# Patient Record
Sex: Male | Born: 1990 | Race: White | Hispanic: No | Marital: Single
Health system: Midwestern US, Community
[De-identification: ages and names within clinical notes are randomized; demographics above are authoritative.]

## PROBLEM LIST (undated history)

## (undated) DIAGNOSIS — K219 Gastro-esophageal reflux disease without esophagitis: Secondary | ICD-10-CM

## (undated) DIAGNOSIS — G40909 Epilepsy, unspecified, not intractable, without status epilepticus: Secondary | ICD-10-CM

---

## 2008-02-04 ENCOUNTER — Emergency Department: Payer: Self-pay | Admitting: Emergency Medicine

## 2010-07-31 ENCOUNTER — Emergency Department: Payer: Self-pay | Admitting: Emergency Medicine

## 2010-08-24 ENCOUNTER — Emergency Department: Payer: Self-pay | Admitting: Emergency Medicine

## 2010-11-25 ENCOUNTER — Emergency Department: Payer: Self-pay | Admitting: Emergency Medicine

## 2011-09-22 ENCOUNTER — Emergency Department (HOSPITAL_COMMUNITY)
Admission: EM | Admit: 2011-09-22 | Discharge: 2011-09-22 | Disposition: A | Discharge status: Home / Self Care | Payer: Self-pay | Attending: Emergency Medicine | Admitting: Emergency Medicine

## 2011-09-22 ENCOUNTER — Emergency Department (HOSPITAL_COMMUNITY): Payer: Self-pay

## 2011-09-22 DIAGNOSIS — S62309A Unspecified fracture of unspecified metacarpal bone, initial encounter for closed fracture: Secondary | ICD-10-CM | POA: Insufficient documentation

## 2011-09-22 DIAGNOSIS — S62308A Unspecified fracture of other metacarpal bone, initial encounter for closed fracture: Secondary | ICD-10-CM

## 2011-09-22 DIAGNOSIS — S62339A Displaced fracture of neck of unspecified metacarpal bone, initial encounter for closed fracture: Secondary | ICD-10-CM

## 2011-09-22 DIAGNOSIS — M7989 Other specified soft tissue disorders: Secondary | ICD-10-CM | POA: Insufficient documentation

## 2011-09-22 MED ORDER — OXYCODONE-ACETAMINOPHEN 5-325 MG PO TABS
2.0000 | ORAL_TABLET | Freq: Once | ORAL | Status: AC
  Administered 2011-09-22: 2 via ORAL
  Filled 2011-09-22: qty 2

## 2011-09-22 MED ORDER — IBUPROFEN 200 MG PO TABS
600.0000 mg | ORAL_TABLET | Freq: Once | ORAL | Status: AC
  Administered 2011-09-22: 600 mg via ORAL
  Filled 2011-09-22: qty 3

## 2011-09-22 MED ORDER — DEXTROSE 5 % IV SOLN: 1.0000 g | INTRAVENOUS | Status: DC

## 2011-09-22 MED ORDER — CHLORHEXIDINE GLUCONATE 4 % EX LIQD: 60.0000 mL | Freq: Once | CUTANEOUS | Status: DC

## 2011-09-22 MED ORDER — OXYCODONE-ACETAMINOPHEN 5-325 MG PO TABS: 1.0000 | ORAL_TABLET | Freq: Once | ORAL | Status: DC
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
## 2011-09-22 NOTE — ED Provider Notes (Signed)
Medical screening examination/treatment/procedure(s) were performed by non-physician practitioner and as supervising physician I was immediately available for consultation/collaboration.   Dayton Bailiff, MD 09/22/11 [phone]

## 2011-09-23 ENCOUNTER — Ambulatory Visit (HOSPITAL_COMMUNITY)
Admission: RE | Admit: 2011-09-23 | Discharge: 2011-09-23 | Disposition: A | Discharge status: Home / Self Care | Source: Ambulatory Visit | Attending: Orthopedic Surgery | Admitting: Orthopedic Surgery

## 2011-09-23 ENCOUNTER — Encounter (HOSPITAL_COMMUNITY)
Admission: RE | Disposition: A | Discharge status: Home / Self Care | Payer: Self-pay | Source: Ambulatory Visit | Attending: Orthopedic Surgery

## 2011-09-23 ENCOUNTER — Other Ambulatory Visit: Payer: Self-pay | Admitting: Orthopedic Surgery

## 2011-09-23 ENCOUNTER — Ambulatory Visit (HOSPITAL_COMMUNITY): Admitting: Anesthesiology

## 2011-09-23 ENCOUNTER — Encounter (HOSPITAL_COMMUNITY): Payer: Self-pay | Admitting: Anesthesiology

## 2011-09-23 ENCOUNTER — Encounter (HOSPITAL_COMMUNITY): Payer: Self-pay | Admitting: *Deleted

## 2011-09-23 DIAGNOSIS — Z01812 Encounter for preprocedural laboratory examination: Secondary | ICD-10-CM | POA: Insufficient documentation

## 2011-09-23 DIAGNOSIS — S62309A Unspecified fracture of unspecified metacarpal bone, initial encounter for closed fracture: Secondary | ICD-10-CM

## 2011-09-23 DIAGNOSIS — Y33XXXA Other specified events, undetermined intent, initial encounter: Secondary | ICD-10-CM | POA: Insufficient documentation

## 2011-09-23 DIAGNOSIS — S62329A Displaced fracture of shaft of unspecified metacarpal bone, initial encounter for closed fracture: Secondary | ICD-10-CM | POA: Insufficient documentation

## 2011-09-23 DIAGNOSIS — K219 Gastro-esophageal reflux disease without esophagitis: Secondary | ICD-10-CM | POA: Insufficient documentation

## 2011-09-23 HISTORY — DX: Gastro-esophageal reflux disease without esophagitis: K21.9

## 2011-09-23 HISTORY — PX: ORIF FINGER FRACTURE: SHX2122

## 2011-09-23 LAB — SURGICAL PCR SCREEN
MRSA, PCR: NEGATIVE
Staphylococcus aureus: POSITIVE — AB

## 2011-09-23 LAB — CBC
Hemoglobin: 14.2 g/dL (ref 13.0–17.0)
MCH: 32.6 pg (ref 26.0–34.0)
MCV: 91.3 fL (ref 78.0–100.0)
RBC: 4.36 MIL/uL (ref 4.22–5.81)

## 2011-09-23 SURGERY — OPEN REDUCTION INTERNAL FIXATION (ORIF) METACARPAL (FINGER) FRACTURE
Anesthesia: General | Site: Hand | Laterality: Left

## 2011-09-23 MED ORDER — FENTANYL CITRATE 0.05 MG/ML IJ SOLN
INTRAMUSCULAR | Status: AC
  Filled 2011-09-23: qty 2

## 2011-09-23 MED ORDER — NEOSTIGMINE METHYLSULFATE 1 MG/ML IJ SOLN
INTRAMUSCULAR | Status: DC | PRN
  Administered 2011-09-23: 3.5 mg via INTRAVENOUS

## 2011-09-23 MED ORDER — ONDANSETRON HCL 4 MG/2ML IJ SOLN
INTRAMUSCULAR | Status: DC | PRN
  Administered 2011-09-23: 4 mg via INTRAVENOUS

## 2011-09-23 MED ORDER — BUPIVACAINE HCL (PF) 0.25 % IJ SOLN
INTRAMUSCULAR | Status: DC | PRN
  Administered 2011-09-23: 7 mL

## 2011-09-23 MED ORDER — GLYCOPYRROLATE 0.2 MG/ML IJ SOLN
INTRAMUSCULAR | Status: DC | PRN
  Administered 2011-09-23: .4 mg via INTRAVENOUS

## 2011-09-23 MED ORDER — LACTATED RINGERS IV SOLN
INTRAVENOUS | Status: DC | PRN
  Administered 2011-09-23: 14:00:00 via INTRAVENOUS

## 2011-09-23 MED ORDER — CEFAZOLIN SODIUM 1-5 GM-% IV SOLN
1.0000 g | INTRAVENOUS | Status: AC
  Administered 2011-09-23: 1 g via INTRAVENOUS
  Filled 2011-09-23 (×2): qty 50

## 2011-09-23 MED ORDER — ONDANSETRON HCL 4 MG/2ML IJ SOLN: 4.0000 mg | Freq: Once | INTRAMUSCULAR | Status: DC | PRN

## 2011-09-23 MED ORDER — ROCURONIUM BROMIDE 100 MG/10ML IV SOLN
INTRAVENOUS | Status: DC | PRN
  Administered 2011-09-23: 40 mg via INTRAVENOUS

## 2011-09-23 MED ORDER — HYDROMORPHONE HCL PF 1 MG/ML IJ SOLN: 0.2500 mg | INTRAMUSCULAR | Status: DC | PRN

## 2011-09-23 MED ORDER — BUPIVACAINE-EPINEPHRINE PF 0.5-1:200000 % IJ SOLN
INTRAMUSCULAR | Status: DC | PRN
  Administered 2011-09-23: 30 mL

## 2011-09-23 MED ORDER — OXYCODONE-ACETAMINOPHEN 5-325 MG PO TABS
1.0000 | ORAL_TABLET | Freq: Once | ORAL | Status: AC
  Administered 2011-09-23: 1 via ORAL

## 2011-09-23 MED ORDER — OXYCODONE-ACETAMINOPHEN 5-325 MG PO TABS
ORAL_TABLET | ORAL | Status: AC
  Filled 2011-09-23: qty 1

## 2011-09-23 MED ORDER — SODIUM CHLORIDE 0.9 % IR SOLN
Status: DC | PRN
  Administered 2011-09-23: 1000 mL

## 2011-09-23 MED ORDER — MIDAZOLAM HCL 2 MG/2ML IJ SOLN
1.0000 mg | INTRAMUSCULAR | Status: DC | PRN
  Administered 2011-09-23: 2 mg via INTRAVENOUS

## 2011-09-23 MED ORDER — MIDAZOLAM HCL 2 MG/2ML IJ SOLN
INTRAMUSCULAR | Status: AC
  Filled 2011-09-23: qty 2

## 2011-09-23 MED ORDER — FENTANYL CITRATE 0.05 MG/ML IJ SOLN
50.0000 ug | INTRAMUSCULAR | Status: DC | PRN
  Administered 2011-09-23: 100 ug via INTRAVENOUS

## 2011-09-23 MED ORDER — PROPOFOL 10 MG/ML IV EMUL
INTRAVENOUS | Status: DC | PRN
  Administered 2011-09-23: 150 mg via INTRAVENOUS

## 2011-09-23 MED ORDER — MUPIROCIN 2 % EX OINT
TOPICAL_OINTMENT | CUTANEOUS | Status: AC
  Administered 2011-09-23: 13:00:00
  Filled 2011-09-23: qty 22

## 2011-09-23 MED ORDER — LACTATED RINGERS IV SOLN
INTRAVENOUS | Status: DC
  Administered 2011-09-23: 14:00:00 via INTRAVENOUS

## 2011-09-23 MED ORDER — MEPERIDINE HCL 25 MG/ML IJ SOLN: 6.2500 mg | INTRAMUSCULAR | Status: DC | PRN

## 2011-09-23 MED ORDER — OXYCODONE-ACETAMINOPHEN 5-325 MG PO TABS: 1.0000 | ORAL_TABLET | Freq: Once | ORAL | Status: AC

## 2011-09-23 SURGICAL SUPPLY — 56 items
BANDAGE ELASTIC 3 VELCRO ST LF (GAUZE/BANDAGES/DRESSINGS) IMPLANT
BANDAGE GAUZE ELAST BULKY 4 IN (GAUZE/BANDAGES/DRESSINGS) ×2 IMPLANT
BNDG ADH 5X3 H2O RPLNT NS (GAUZE/BANDAGES/DRESSINGS)
BNDG CMPR 9X4 STRL LF SNTH (GAUZE/BANDAGES/DRESSINGS) ×2
BNDG COHESIVE 1X5 TAN STRL LF (GAUZE/BANDAGES/DRESSINGS) IMPLANT
BNDG COHESIVE 3X5 WHT NS (GAUZE/BANDAGES/DRESSINGS) IMPLANT
BNDG ESMARK 4X9 LF (GAUZE/BANDAGES/DRESSINGS) ×4 IMPLANT
CLOTH BEACON ORANGE TIMEOUT ST (SAFETY) ×2 IMPLANT
CORDS BIPOLAR (ELECTRODE) ×2 IMPLANT
COVER SURGICAL LIGHT HANDLE (MISCELLANEOUS) ×2 IMPLANT
CUFF TOURNIQUET SINGLE 18IN (TOURNIQUET CUFF) ×2 IMPLANT
CUFF TOURNIQUET SINGLE 24IN (TOURNIQUET CUFF) IMPLANT
DRAPE OEC MINIVIEW 54X84 (DRAPES) ×4 IMPLANT
DRAPE SURG 17X23 STRL (DRAPES) ×2 IMPLANT
DURAPREP 26ML APPLICATOR (WOUND CARE) ×2 IMPLANT
GAUZE SPONGE 2X2 8PLY STRL LF (GAUZE/BANDAGES/DRESSINGS) IMPLANT
GAUZE SPONGE 4X4 12PLY STRL LF (GAUZE/BANDAGES/DRESSINGS) ×2 IMPLANT
GAUZE XEROFORM 1X8 LF (GAUZE/BANDAGES/DRESSINGS) ×2 IMPLANT
GLOVE BIO SURGEON STRL SZ8.5 (GLOVE) ×2 IMPLANT
GLOVE BIOGEL PI IND STRL 7.0 (GLOVE) ×2 IMPLANT
GLOVE BIOGEL PI INDICATOR 7.0 (GLOVE) ×2
GLOVE ECLIPSE 7.5 STRL STRAW (GLOVE) ×2 IMPLANT
GOWN SRG XL XLNG 56XLVL 4 (GOWN DISPOSABLE) IMPLANT
GOWN STRL NON-REIN LRG LVL3 (GOWN DISPOSABLE) ×2 IMPLANT
GOWN STRL NON-REIN XL XLG LVL4 (GOWN DISPOSABLE)
GOWN STRL REIN XL XLG (GOWN DISPOSABLE) ×2 IMPLANT
KIT BASIN OR (CUSTOM PROCEDURE TRAY) ×2 IMPLANT
KIT ROOM TURNOVER OR (KITS) ×2 IMPLANT
KWIRE 4.0 X .045IN (WIRE) ×2 IMPLANT
MANIFOLD NEPTUNE II (INSTRUMENTS) ×2 IMPLANT
NAIL FIXATION IM (Nail) ×4 IMPLANT
NEEDLE HYPO 25GX1X1/2 BEV (NEEDLE) IMPLANT
NEEDLE HYPO 25X1 1.5 SAFETY (NEEDLE) ×2 IMPLANT
NS IRRIG 1000ML POUR BTL (IV SOLUTION) ×2 IMPLANT
PACK ORTHO EXTREMITY (CUSTOM PROCEDURE TRAY) ×2 IMPLANT
PAD ARMBOARD 7.5X6 YLW CONV (MISCELLANEOUS) ×2 IMPLANT
PAD CAST 3X4 CTTN HI CHSV (CAST SUPPLIES) IMPLANT
PADDING CAST COTTON 3X4 STRL (CAST SUPPLIES)
PADDING WEBRIL 4 STERILE (GAUZE/BANDAGES/DRESSINGS) ×2 IMPLANT
SPLINT PLASTER X FASTSET 4X15 (CAST SUPPLIES) ×2 IMPLANT
SPONGE GAUZE 2X2 STER 10/PKG (GAUZE/BANDAGES/DRESSINGS)
SPONGE GAUZE 4X4 12PLY (GAUZE/BANDAGES/DRESSINGS) IMPLANT
STRIP CLOSURE SKIN 1/2X4 (GAUZE/BANDAGES/DRESSINGS) IMPLANT
SUCTION FRAZIER TIP 10 FR DISP (SUCTIONS) ×2 IMPLANT
SUT ETHILON 4 0 PS 2 18 (SUTURE) IMPLANT
SUT ETHILON 5 0 PS 2 18 (SUTURE) IMPLANT
SUT PROLENE 3 0 PS 2 (SUTURE) IMPLANT
SUT VIC AB 3-0 FS2 27 (SUTURE) IMPLANT
SUT VIC AB 4-0 P-3 18X BRD (SUTURE) IMPLANT
SUT VIC AB 4-0 P3 18 (SUTURE)
SYR CONTROL 10ML LL (SYRINGE) ×2 IMPLANT
TOWEL OR 17X24 6PK STRL BLUE (TOWEL DISPOSABLE) ×2 IMPLANT
TOWEL OR 17X26 10 PK STRL BLUE (TOWEL DISPOSABLE) ×2 IMPLANT
TUBE CONNECTING 12X1/4 (SUCTIONS) ×2 IMPLANT
UNDERPAD 30X30 INCONTINENT (UNDERPADS AND DIAPERS) ×2 IMPLANT
WATER STERILE IRR 1000ML POUR (IV SOLUTION) ×2 IMPLANT

## 2011-09-23 NOTE — Anesthesia Preprocedure Evaluation (Addendum)
Anesthesia Evaluation  Patient identified by MRN, date of birth, ID band Patient awake    Reviewed: Allergy & Precautions, H&P , Patient's Chart, lab work & pertinent test results  Airway       Dental   Pulmonary          Cardiovascular     Neuro/Psych    GI/Hepatic Medicated and Controlled,  Endo/Other    Renal/GU      Musculoskeletal   Abdominal   Peds  Hematology   Anesthesia Other Findings   Reproductive/Obstetrics                           Anesthesia Physical Anesthesia Plan  ASA: II  Anesthesia Plan: General   Post-op Pain Management:    Induction:   Airway Management Planned:   Additional Equipment:   Intra-op Plan:   Post-operative Plan:   Informed Consent: I have reviewed the patients History and Physical, chart, labs and discussed the procedure including the risks, benefits and alternatives for the proposed anesthesia with the patient or authorized representative who has indicated his/her understanding and acceptance.     Plan Discussed with: CRNA and Surgeon  Anesthesia Plan Comments:         Anesthesia Quick Evaluation

## 2011-09-23 NOTE — Preoperative (Signed)
Beta Blockers   Reason not to administer Beta Blockers:Not Applicable 

## 2011-09-23 NOTE — Op Note (Signed)
NAMEGAVYNN, DUVALL            ACCOUNT NO.:  [id number]  MEDICAL RECORD NO.:  [id number]  LOCATION:  [facility]                         FACILITY:  [facility]  PHYSICIAN:  Artist Pais. Christelle Igoe, M.D.DATE OF BIRTH:  [birth year]  DATE OF PROCEDURE:  09/23/2011 DATE OF DISCHARGE:  09/23/2011                              OPERATIVE REPORT   PREOPERATIVE DIAGNOSIS:  Displaced left small and ring finger metacarpal fractures.  POSTOPERATIVE DIAGNOSIS:  Displaced left small and ring finger metacarpal fractures.  PROCEDURE:  Open reduction internal fixation of above using 1.6-mm IM rods.  SURGEON:  Artist Pais. Mina Marble, MD  ASSISTANT:  None.  ANESTHESIA:  General.  TOURNIQUET TIME:  52 minutes.  COMPLICATIONS:  None.  DRAINS:  None.  DESCRIPTION OF PROCEDURE:  The patient was taken to the operating suite. After the induction of general anesthesia, left upper extremity was prepped and draped in sterile fashion.  An Esmarch was used to exsanguinate the limb.  Tourniquet was then inflated to 250 mmHg.  At this point in time, incision was made over the base of the small finger metacarpal.  Fluoroscopic guidance was used to identify the metaphysis- diaphyseal flare.  A small incision was made.  Extensor tendons were retracted.  The 1.6-mm [employer] IM rod was introduced into the intramedullary canal and driven across the fracture site under fluoroscopic guidance, was cut below the skin, bent to 90 degrees. Incision was then extended transversely through the base of the ring metacarpal.  Cortical window was made using an awl and the rod was passed through the proximal fragment, however, could not be passed into the distal fragment.  Intraoperative fluoroscopy revealed a significantly tight isthmus of the intramedullary canal.  Therefore, the fracture site was opened through an extended incision longitudinally and distally, flap was raised.  The fracture site was opened and a 4.5 K- wire  was used to ream intramedullary canal.  The IM rod was then passed across the fracture site.  The same procedure was performed as far as the rod being bent and cut below the skin.  The wound was irrigated and loosely closed with 4-0 Vicryl Rapide suture. Xeroform, 4x4s, fluffs, and a compressive dressing was applied, as well as a volar splint.  The patient tolerated the procedure well and went to the recovery room in a stable fashion.     Artist Pais Mina Marble, M.D.     MAW/MEDQ  D:  09/23/2011  T:  09/23/2011  Job:  [id number]

## 2011-09-23 NOTE — Discharge Instructions (Signed)
Call office for follow up next week  Do not change dressing  Keep elevated with iceHand Fracture Your caregiver has diagnosed you with a fractured (broken) bone in your hand. If the bones are in good position and the hand is properly immobilized and rested, these injuries will usually heal in 3 to 6 weeks. A cast, splint, or bulky bandage is usually applied to keep the fracture site from moving. Do not remove the splint or cast until your caregiver approves. If the fracture is unstable or the bones are not aligned properly, surgery may be needed. Keep your hand raised (elevated) above the level of your heart as much as possible for the next 2 to 3 days until the swelling and pain are better. Apply ice packs for 15 to 20 minutes every 3 to 4 hours to help control the pain and swelling. See your caregiver or an orthopedic specialist as directed for follow-up care to make sure the fracture is beginning to heal properly. SEEK IMMEDIATE MEDICAL CARE IF:   You notice your fingers are cold, numb, crooked, or the pain of your injury is severe.   You are not improving or seem to be getting worse.   You have questions or concerns.  Document Released: 11/28/2004 Document Revised: 07/03/2011 Document Reviewed: 04/18/2009 [facility] Patient Information 2012 [place], [place].
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
## 2011-09-23 NOTE — Anesthesia Procedure Notes (Signed)
Anesthesia Regional Block:  Supraclavicular block  Pre-Anesthetic Checklist: ,, timeout performed, Correct Patient, Correct Site, Correct Laterality, Correct Procedure, Correct Position, site marked, Risks and benefits discussed,  Surgical consent,  Pre-op evaluation,  At surgeon's request and post-op pain management  Laterality: Left  Prep: chloraprep       Needles:  Injection technique: Single-shot  Needle Type: Echogenic Stimulator Needle     Needle Length: 5cm 5 cm Needle Gauge: 21 G    Additional Needles:  Procedures: ultrasound guided Supraclavicular block  Nerve Stimulator or Paresthesia:  Response: 0.4 mA,   Additional Responses:   Narrative:  Start time: 09/23/2011 1:50 PM End time: 09/23/2011 2:05 PM Injection made incrementally with aspirations every 5 mL.  Performed by: Personally  Anesthesiologist: Arta Bruce MD

## 2011-09-23 NOTE — H&P (Signed)
  See dictated note [phone]

## 2011-09-23 NOTE — Anesthesia Postprocedure Evaluation (Signed)
  Anesthesia Post-op Note  Patient: Jeremiah Alvarez  Procedure(s) Performed:  OPEN REDUCTION INTERNAL FIXATION (ORIF) METACARPAL (FINGER) FRACTURE - orif left ring and small metacarpal fractures  Patient Location: PACU  Anesthesia Type: General  Level of Consciousness: awake, oriented, sedated and patient cooperative  Airway and Oxygen Therapy: Patient Spontanous Breathing and Patient connected to nasal cannula oxygen  Post-op Pain: none  Post-op Assessment: Post-op Vital signs reviewed, Patient's Cardiovascular Status Stable, Respiratory Function Stable, Patent Airway, No signs of Nausea or vomiting and Pain level controlled  Post-op Vital Signs: stable  Complications: No apparent anesthesia complications

## 2011-09-23 NOTE — Brief Op Note (Signed)
09/23/2011  3:33 PM  PATIENT:  Jeremiah Alvarez  20 y.o. male  PRE-OPERATIVE DIAGNOSIS:  left ring finger and small metacarpal fractures  POST-OPERATIVE DIAGNOSIS:  left ring finger and small metacarpal fractures  PROCEDURE:  Procedure(s): OPEN REDUCTION INTERNAL FIXATION (ORIF) METACARPAL (FINGER) FRACTURE  SURGEON:  Surgeon(s): Marlowe Shores, MD  PHYSICIAN ASSISTANT:   ASSISTANTS: none   ANESTHESIA:   general  EBL:  Total I/O In: 700 [I.V.:700] Out: -   BLOOD ADMINISTERED:none  DRAINS: none   LOCAL MEDICATIONS USED:  MARCAINE 10CC  SPECIMEN:  No Specimen  DISPOSITION OF SPECIMEN:  N/A  COUNTS:  YES  TOURNIQUET:   Total Tourniquet Time Documented: Upper Arm (Left) - 60 minutes  DICTATION: .Other Dictation: Dictation Number [id number]  PLAN OF CARE: Discharge to home after PACU  PATIENT DISPOSITION:  PACU - hemodynamically stable.   Delay start of Pharmacological VTE agent (>24hrs) due to surgical blood loss or risk of bleeding:  {YES/NO/NOT APPLICABLE:20182

## 2011-09-23 NOTE — Transfer of Care (Signed)
Immediate Anesthesia Transfer of Care Note  Patient: Jeremiah Alvarez  Procedure(s) Performed:  OPEN REDUCTION INTERNAL FIXATION (ORIF) METACARPAL (FINGER) FRACTURE - orif left ring and small metacarpal fractures  Patient Location: PACU  Anesthesia Type: General  Level of Consciousness: awake, alert  and oriented  Airway & Oxygen Therapy: Patient Spontanous Breathing and Patient connected to nasal cannula oxygen  Post-op Assessment: Report given to PACU RN, Post -op Vital signs reviewed and stable and Patient moving all extremities  Post vital signs: Reviewed and stable  Complications: No apparent anesthesia complications

## 2011-09-24 ENCOUNTER — Encounter (HOSPITAL_COMMUNITY): Payer: Self-pay | Admitting: Orthopedic Surgery

## 2011-11-05 ENCOUNTER — Emergency Department (HOSPITAL_COMMUNITY): Payer: Self-pay

## 2011-11-05 ENCOUNTER — Emergency Department (HOSPITAL_COMMUNITY)
Admission: EM | Admit: 2011-11-05 | Discharge: 2011-11-05 | Disposition: A | Discharge status: Home / Self Care | Payer: Self-pay | Attending: Emergency Medicine | Admitting: Emergency Medicine

## 2011-11-05 ENCOUNTER — Encounter (HOSPITAL_COMMUNITY): Payer: Self-pay | Admitting: Emergency Medicine

## 2011-11-05 DIAGNOSIS — M79609 Pain in unspecified limb: Secondary | ICD-10-CM | POA: Insufficient documentation

## 2011-11-05 DIAGNOSIS — Z9889 Other specified postprocedural states: Secondary | ICD-10-CM | POA: Insufficient documentation

## 2011-11-05 DIAGNOSIS — S6990XA Unspecified injury of unspecified wrist, hand and finger(s), initial encounter: Secondary | ICD-10-CM | POA: Insufficient documentation

## 2011-11-05 DIAGNOSIS — M25549 Pain in joints of unspecified hand: Secondary | ICD-10-CM | POA: Insufficient documentation

## 2011-11-05 DIAGNOSIS — K219 Gastro-esophageal reflux disease without esophagitis: Secondary | ICD-10-CM | POA: Insufficient documentation

## 2011-11-05 DIAGNOSIS — F17200 Nicotine dependence, unspecified, uncomplicated: Secondary | ICD-10-CM | POA: Insufficient documentation

## 2011-11-05 DIAGNOSIS — Z79899 Other long term (current) drug therapy: Secondary | ICD-10-CM | POA: Insufficient documentation

## 2011-11-05 DIAGNOSIS — W1809XA Striking against other object with subsequent fall, initial encounter: Secondary | ICD-10-CM | POA: Insufficient documentation

## 2011-11-05 MED ORDER — OXYCODONE-ACETAMINOPHEN 5-325 MG PO TABS: 1.0000 | ORAL_TABLET | ORAL | Status: AC | PRN

## 2011-11-05 MED ORDER — OXYCODONE-ACETAMINOPHEN 5-325 MG PO TABS
2.0000 | ORAL_TABLET | Freq: Once | ORAL | Status: AC
  Administered 2011-11-05: 2 via ORAL
  Filled 2011-11-05: qty 2

## 2011-11-05 NOTE — ED Notes (Signed)
Per pt: fell and injured left hand last night; hand is swollen with painful movement of 4th and 5th fingers, cap refill normal

## 2011-11-05 NOTE — ED Notes (Signed)
Splint applied by ortho tech

## 2011-11-05 NOTE — ED Provider Notes (Signed)
History     CSN: [id number]  Arrival date & time 11/05/11  1739   First MD Initiated Contact with Patient 11/05/11 1853      Chief Complaint  Patient presents with  . Hand Injury    Left side   HPI Patient presents to the ED with complaint of FOSH injury last night to the left hand. Reports pain over the area he had an ORIF in November. Denies any other problems. Denies any other injuries. Full sensation.   Past Medical History  Diagnosis Date  . GERD (gastroesophageal reflux disease)     Past Surgical History  Procedure Date  . Orif finger fracture 09/23/2011    Procedure: OPEN REDUCTION INTERNAL FIXATION (ORIF) METACARPAL (FINGER) FRACTURE;  Surgeon: Marlowe Shores, MD;  Location: [facility] OR;  Service: Orthopedics;  Laterality: Left;  orif left ring and small metacarpal fractures    History reviewed. No pertinent family history.  History  Substance Use Topics  . Smoking status: Current Everyday Smoker -- 0.5 packs/day for 10 years    Types: Cigarettes  . Smokeless tobacco: Not on file  . Alcohol Use: Yes      Review of Systems  Constitutional: Negative for fever, chills, diaphoresis and appetite change.  HENT: Negative for neck pain.   Eyes: Negative for photophobia and visual disturbance.  Respiratory: Negative for cough, chest tightness and shortness of breath.   Cardiovascular: Negative for chest pain.  Gastrointestinal: Negative for nausea, vomiting and abdominal pain.  Genitourinary: Negative for flank pain.  Musculoskeletal: Negative for back pain, joint swelling and gait problem.  Skin: Negative for rash and wound.  Neurological: Negative for weakness and numbness.  All other systems reviewed and are negative.    Allergies  Review of patient's allergies indicates no known allergies.  Home Medications   Current Outpatient Rx  Name Route Sig Dispense Refill  . ESOMEPRAZOLE MAGNESIUM 20 MG PO CPDR Oral Take 20 mg by mouth daily before breakfast.         BP 115/70  Pulse 88  Temp(Src) 98.4 F (36.9 C) (Oral)  Resp 18  SpO2 96%  Physical Exam  Nursing note and vitals reviewed. Constitutional: He is oriented to person, place, and time. He appears well-developed and well-nourished. No distress.  HENT:  Head: Normocephalic and atraumatic.  Mouth/Throat: Oropharynx is clear and moist.  Eyes: EOM are normal. Pupils are equal, round, and reactive to light.  Neck: Normal range of motion. Neck supple.  Cardiovascular: Normal rate, regular rhythm, normal heart sounds and intact distal pulses.  Exam reveals no gallop and no friction rub.   No murmur heard. Pulmonary/Chest: Effort normal and breath sounds normal. No respiratory distress. He has no wheezes. He exhibits no tenderness.  Abdominal: Soft. Bowel sounds are normal. There is no tenderness. There is no rebound and no guarding.  Musculoskeletal: Normal range of motion. He exhibits tenderness.       Tenderness to palpation to the left fourth/fifth metacarpal. Full sensation. Radial pulse intact. Normal ROM.   Neurological: He is alert and oriented to person, place, and time. He displays normal reflexes. No cranial nerve deficit or sensory deficit. He exhibits normal muscle tone. He displays a negative Romberg sign. Coordination and gait normal. GCS eye subscore is 4. GCS verbal subscore is 5. GCS motor subscore is 6. He displays no Babinski's sign on the right side. He displays no Babinski's sign on the left side.       Normal finger to nose  testing. Normal grip. No pronator drift. No nystagmus on examination.  Skin: Skin is warm and dry. No rash noted. He is not diaphoretic. No erythema. No pallor.  Psychiatric: He has a normal mood and affect. His behavior is normal. Judgment and thought content normal.    ED Course  Procedures (including critical care time)  Patient seen and evaluated.  VSS reviewed. . Nursing notes reviewed. Discussed with attending physician, dr. Patria Mane. Initial  testing ordered. Will monitor the patient closely. They agree with the treatment plan and diagnosis.   Labs Reviewed - No data to display Dg Hand Complete Left  11/05/2011  *RADIOLOGY REPORT*  Clinical Data: Pain.  Surgery of the hand 1 month ago.  The patient fell last night, injuring the hand.  LEFT HAND - COMPLETE 3+ VIEW  Comparison: 09/23/2011  Findings: The patient has undergone pinning of the fourth and fifth metacarpals.  There is callus at both fracture sites.  No evidence for new fracture.  There is persistent lucency at the fracture sites however, raising the question of non healing.  Alignment is normal. There is soft tissue swelling along the ulnar aspect of the fifth metacarpal.  IMPRESSION:  1.  No evidence for new fracture. 2.  Status post ORIF of fourth and fifth metacarpal fractures. Question of non healing at the fracture sites.  Original Report Authenticated By: Patterson Hammersmith, M.D.   No diagnosis found.  7:54 PM spoke to dr. Merlyn Lot who advised placing patient in ulnar gutter splint, and follow up with dr. Mina Marble in the office this week.    MDM  Hand injury, left. No signs of acute fracture. Ulnar gutter splint applied.         Demetrius Charity, PA 11/05/11 2000

## 2011-11-06 NOTE — ED Provider Notes (Signed)
Medical screening examination/treatment/procedure(s) were performed by non-physician practitioner and as supervising physician I was immediately available for consultation/collaboration.   Lyanne Co, MD 11/06/11 [phone]

## 2011-11-09 ENCOUNTER — Encounter (HOSPITAL_COMMUNITY): Payer: Self-pay | Admitting: *Deleted

## 2011-11-09 ENCOUNTER — Emergency Department (HOSPITAL_COMMUNITY)
Admission: EM | Admit: 2011-11-09 | Discharge: 2011-11-09 | Disposition: A | Discharge status: Home / Self Care | Payer: Self-pay | Attending: Emergency Medicine | Admitting: Emergency Medicine

## 2011-11-09 DIAGNOSIS — M79642 Pain in left hand: Secondary | ICD-10-CM

## 2011-11-09 DIAGNOSIS — M7989 Other specified soft tissue disorders: Secondary | ICD-10-CM | POA: Insufficient documentation

## 2011-11-09 DIAGNOSIS — K219 Gastro-esophageal reflux disease without esophagitis: Secondary | ICD-10-CM | POA: Insufficient documentation

## 2011-11-09 DIAGNOSIS — Z79899 Other long term (current) drug therapy: Secondary | ICD-10-CM | POA: Insufficient documentation

## 2011-11-09 DIAGNOSIS — M79609 Pain in unspecified limb: Secondary | ICD-10-CM | POA: Insufficient documentation

## 2011-11-09 DIAGNOSIS — Z9889 Other specified postprocedural states: Secondary | ICD-10-CM | POA: Insufficient documentation

## 2011-11-09 DIAGNOSIS — F17200 Nicotine dependence, unspecified, uncomplicated: Secondary | ICD-10-CM | POA: Insufficient documentation

## 2011-11-09 MED ORDER — HYDROCODONE-ACETAMINOPHEN 5-325 MG PO TABS
1.0000 | ORAL_TABLET | Freq: Once | ORAL | Status: AC
  Administered 2011-11-09: 1 via ORAL
  Filled 2011-11-09: qty 1

## 2011-11-09 MED ORDER — HYDROCODONE-ACETAMINOPHEN 5-325 MG PO TABS: 1.0000 | ORAL_TABLET | Freq: Four times a day (QID) | ORAL | Status: AC | PRN

## 2011-11-09 NOTE — ED Provider Notes (Signed)
History     CSN: [id number]  Arrival date & time 11/09/11  1858   First MD Initiated Contact with Patient 11/09/11 2110      Chief Complaint  Patient presents with  . Hand Pain    s/p hand surgery on 09/23/11    (Consider location/radiation/quality/duration/timing/severity/associated sxs/prior treatment) HPI Comments: Patient presents the chief complaint of left hand pain.  He had a boxer's fracture repair done on 09/23/2011.  On 11/05/2011 the patient fell and presented to the emergency department with pain.  At that time he was re x-rayed with the finding of no new acute fractures however possible nonhealing of his previously repaired fracture.  Patient presents today because the pain has become unbearable and he is unable to followup with his orthopedic until next week.  Patient has no other complaints.  Patient is a 21 y.o. male presenting with hand pain. The history is provided by the patient.  Hand Pain Pertinent negatives include no abdominal pain, chest pain, chills, congestion, fever, headaches, numbness or weakness.    Past Medical History  Diagnosis Date  . GERD (gastroesophageal reflux disease)     Past Surgical History  Procedure Date  . Orif finger fracture 09/23/2011    Procedure: OPEN REDUCTION INTERNAL FIXATION (ORIF) METACARPAL (FINGER) FRACTURE;  Surgeon: Marlowe Shores, MD;  Location: [facility] OR;  Service: Orthopedics;  Laterality: Left;  orif left ring and small metacarpal fractures    History reviewed. No pertinent family history.  History  Substance Use Topics  . Smoking status: Current Everyday Smoker -- 0.5 packs/day for 10 years    Types: Cigarettes  . Smokeless tobacco: Not on file  . Alcohol Use: Yes      Review of Systems  Constitutional: Negative for fever, chills and appetite change.  HENT: Negative for congestion.   Eyes: Negative for visual disturbance.  Respiratory: Negative for shortness of breath.   Cardiovascular: Negative for  chest pain and leg swelling.  Gastrointestinal: Negative for abdominal pain.  Genitourinary: Negative for dysuria, urgency and frequency.  Neurological: Negative for dizziness, syncope, weakness, light-headedness, numbness and headaches.  Psychiatric/Behavioral: Negative for confusion.    Allergies  Review of patient's allergies indicates no known allergies.  Home Medications   Current Outpatient Rx  Name Route Sig Dispense Refill  . ESOMEPRAZOLE MAGNESIUM 40 MG PO CPDR Oral Take 40 mg by mouth daily before breakfast.      . OXYCODONE-ACETAMINOPHEN 5-325 MG PO TABS Oral Take 1 tablet by mouth every 4 (four) hours as needed for pain. May take 2 tablets PO q 6 hours for severe pain - Do not take with Tylenol as this tablet already contains tylenol 15 tablet 0    There were no vitals taken for this visit.  Physical Exam  Nursing note and vitals reviewed. Constitutional: He is oriented to person, place, and time. He appears well-developed and well-nourished. No distress.  HENT:  Head: Normocephalic and atraumatic.  Eyes:       Normal appearance  Neck: Normal range of motion.  Musculoskeletal:       Mild swelling and tenderness of left hand dorsal surface.  Postoperative scar noted.  Patients from surgery palpable.  Full range of motion of wrist intact.  Passive and active range of motion to 90s pain.  Sensation intact.  Neurological: He is alert and oriented to person, place, and time.  Psychiatric: He has a normal mood and affect. His behavior is normal.    ED Course  Procedures (  including critical care time)  Labs Reviewed - No data to display No results found.   No diagnosis found.  No new injury or trauma has occurred since the pack patient's previous x-ray on the first.Patient will be placed in an ulnar gutter splint with recommendation to keep his followup appointment next week with Dr. Mina Marble.  Patient will be discharged with pain management as well and instruction to  elevate and ice the area.    MDM  Post op hand pain         Jaci Carrel, [place] 11/09/11 2155

## 2011-11-09 NOTE — ED Notes (Signed)
Pt had surgery for a severe boxer's fracture that he sustained in November.  Pt had reparative surgery on 09/23/11 for said fracture.  On 11/05/11, pt fell on wet steps and caught himself with the affected hand.  Since that point, the pt feels as if the pin that was placed in his hand has shifted.  Pt presents tonight with severe hand pain on the dorsal aspect of his left side.

## 2011-11-10 NOTE — ED Provider Notes (Signed)
Medical screening examination/treatment/procedure(s) were performed by non-physician practitioner and as supervising physician I was immediately available for consultation/collaboration.  Nicholes Stairs, MD 11/10/11 Moses Manners

## 2012-02-19 ENCOUNTER — Encounter (HOSPITAL_BASED_OUTPATIENT_CLINIC_OR_DEPARTMENT_OTHER): Payer: Self-pay | Admitting: *Deleted

## 2012-02-19 ENCOUNTER — Other Ambulatory Visit: Payer: Self-pay | Admitting: Orthopedic Surgery

## 2012-02-21 ENCOUNTER — Encounter (HOSPITAL_BASED_OUTPATIENT_CLINIC_OR_DEPARTMENT_OTHER): Payer: Self-pay | Admitting: Anesthesiology

## 2012-02-21 ENCOUNTER — Ambulatory Visit (HOSPITAL_BASED_OUTPATIENT_CLINIC_OR_DEPARTMENT_OTHER): Payer: Self-pay | Admitting: Anesthesiology

## 2012-02-21 ENCOUNTER — Encounter (HOSPITAL_BASED_OUTPATIENT_CLINIC_OR_DEPARTMENT_OTHER)
Admission: RE | Disposition: A | Discharge status: Home / Self Care | Payer: Self-pay | Source: Ambulatory Visit | Attending: Orthopedic Surgery

## 2012-02-21 ENCOUNTER — Ambulatory Visit (HOSPITAL_BASED_OUTPATIENT_CLINIC_OR_DEPARTMENT_OTHER)
Admission: RE | Admit: 2012-02-21 | Discharge: 2012-02-21 | Disposition: A | Discharge status: Home / Self Care | Payer: Self-pay | Source: Ambulatory Visit | Attending: Orthopedic Surgery | Admitting: Orthopedic Surgery

## 2012-02-21 ENCOUNTER — Encounter (HOSPITAL_BASED_OUTPATIENT_CLINIC_OR_DEPARTMENT_OTHER): Payer: Self-pay | Admitting: *Deleted

## 2012-02-21 DIAGNOSIS — X58XXXS Exposure to other specified factors, sequela: Secondary | ICD-10-CM | POA: Insufficient documentation

## 2012-02-21 DIAGNOSIS — S42309S Unspecified fracture of shaft of humerus, unspecified arm, sequela: Secondary | ICD-10-CM | POA: Insufficient documentation

## 2012-02-21 DIAGNOSIS — Z472 Encounter for removal of internal fixation device: Secondary | ICD-10-CM | POA: Insufficient documentation

## 2012-02-21 DIAGNOSIS — S62329A Displaced fracture of shaft of unspecified metacarpal bone, initial encounter for closed fracture: Secondary | ICD-10-CM

## 2012-02-21 HISTORY — PX: HARDWARE REMOVAL: SHX979

## 2012-02-21 SURGERY — REMOVAL, HARDWARE
Anesthesia: General | Site: Hand | Laterality: Left | Wound class: Clean

## 2012-02-21 MED ORDER — OXYCODONE-ACETAMINOPHEN 5-325 MG PO TABS: 1.0000 | ORAL_TABLET | ORAL | Status: AC | PRN

## 2012-02-21 MED ORDER — CHLORHEXIDINE GLUCONATE 4 % EX LIQD: 60.0000 mL | Freq: Once | CUTANEOUS | Status: DC

## 2012-02-21 MED ORDER — METOCLOPRAMIDE HCL 5 MG/ML IJ SOLN: 10.0000 mg | Freq: Once | INTRAMUSCULAR | Status: DC | PRN

## 2012-02-21 MED ORDER — BUPIVACAINE HCL (PF) 0.25 % IJ SOLN
INTRAMUSCULAR | Status: DC | PRN
  Administered 2012-02-21: 5 mL

## 2012-02-21 MED ORDER — CEFAZOLIN SODIUM 1-5 GM-% IV SOLN
1.0000 g | INTRAVENOUS | Status: AC
  Administered 2012-02-21: 1 g via INTRAVENOUS

## 2012-02-21 MED ORDER — FENTANYL CITRATE 0.05 MG/ML IJ SOLN
INTRAMUSCULAR | Status: DC | PRN
  Administered 2012-02-21: 100 ug via INTRAVENOUS

## 2012-02-21 MED ORDER — PROPOFOL 10 MG/ML IV EMUL
INTRAVENOUS | Status: DC | PRN
  Administered 2012-02-21: 250 mg via INTRAVENOUS

## 2012-02-21 MED ORDER — DEXAMETHASONE SODIUM PHOSPHATE 10 MG/ML IJ SOLN
INTRAMUSCULAR | Status: DC | PRN
  Administered 2012-02-21: 10 mg via INTRAVENOUS

## 2012-02-21 MED ORDER — LACTATED RINGERS IV SOLN
INTRAVENOUS | Status: DC
  Administered 2012-02-21 (×2): via INTRAVENOUS

## 2012-02-21 MED ORDER — LIDOCAINE HCL (CARDIAC) 20 MG/ML IV SOLN
INTRAVENOUS | Status: DC | PRN
  Administered 2012-02-21: 100 mg via INTRAVENOUS

## 2012-02-21 MED ORDER — DROPERIDOL 2.5 MG/ML IJ SOLN
INTRAMUSCULAR | Status: DC | PRN
  Administered 2012-02-21: 0.625 mg via INTRAVENOUS

## 2012-02-21 MED ORDER — MIDAZOLAM HCL 5 MG/5ML IJ SOLN
INTRAMUSCULAR | Status: DC | PRN
  Administered 2012-02-21: 2 mg via INTRAVENOUS

## 2012-02-21 MED ORDER — ONDANSETRON HCL 4 MG/2ML IJ SOLN
INTRAMUSCULAR | Status: DC | PRN
  Administered 2012-02-21: 4 mg via INTRAVENOUS

## 2012-02-21 MED ORDER — OXYCODONE-ACETAMINOPHEN 5-325 MG PO TABS
1.0000 | ORAL_TABLET | Freq: Once | ORAL | Status: AC | PRN
  Administered 2012-02-21: 1 via ORAL

## 2012-02-21 MED ORDER — FENTANYL CITRATE 0.05 MG/ML IJ SOLN
25.0000 ug | INTRAMUSCULAR | Status: DC | PRN
  Administered 2012-02-21: 25 ug via INTRAVENOUS

## 2012-02-21 SURGICAL SUPPLY — 48 items
APL SKNCLS STERI-STRIP NONHPOA (GAUZE/BANDAGES/DRESSINGS)
BANDAGE ELASTIC 3 VELCRO ST LF (GAUZE/BANDAGES/DRESSINGS) ×2 IMPLANT
BANDAGE ELASTIC 4 VELCRO ST LF (GAUZE/BANDAGES/DRESSINGS) IMPLANT
BANDAGE GAUZE ELAST BULKY 4 IN (GAUZE/BANDAGES/DRESSINGS) ×2 IMPLANT
BENZOIN TINCTURE PRP APPL 2/3 (GAUZE/BANDAGES/DRESSINGS) IMPLANT
BLADE MINI RND TIP GREEN BEAV (BLADE) IMPLANT
BLADE SURG 15 STRL LF DISP TIS (BLADE) ×1 IMPLANT
BLADE SURG 15 STRL SS (BLADE) ×2
BNDG CMPR 9X4 STRL LF SNTH (GAUZE/BANDAGES/DRESSINGS) ×1
BNDG ESMARK 4X9 LF (GAUZE/BANDAGES/DRESSINGS) ×2 IMPLANT
CLOTH BEACON ORANGE TIMEOUT ST (SAFETY) ×2 IMPLANT
CORDS BIPOLAR (ELECTRODE) ×2 IMPLANT
COVER TABLE BACK 60X90 (DRAPES) ×2 IMPLANT
CUFF TOURNIQUET SINGLE 18IN (TOURNIQUET CUFF) ×2 IMPLANT
DECANTER SPIKE VIAL GLASS SM (MISCELLANEOUS) IMPLANT
DRAPE EXTREMITY T 121X128X90 (DRAPE) ×2 IMPLANT
DRAPE OEC MINIVIEW 54X84 (DRAPES) IMPLANT
DRAPE SURG 17X23 STRL (DRAPES) ×2 IMPLANT
DURAPREP 26ML APPLICATOR (WOUND CARE) ×2 IMPLANT
GAUZE SPONGE 4X4 16PLY XRAY LF (GAUZE/BANDAGES/DRESSINGS) ×2 IMPLANT
GAUZE XEROFORM 1X8 LF (GAUZE/BANDAGES/DRESSINGS) ×4 IMPLANT
GLOVE BIO SURGEON STRL SZ8.5 (GLOVE) ×2 IMPLANT
GLOVE BIOGEL M STRL SZ7.5 (GLOVE) ×2 IMPLANT
GOWN PREVENTION PLUS XLARGE (GOWN DISPOSABLE) IMPLANT
GOWN PREVENTION PLUS XXLARGE (GOWN DISPOSABLE) ×4 IMPLANT
NEEDLE HYPO 25X1 1.5 SAFETY (NEEDLE) ×2 IMPLANT
PACK BASIN DAY SURGERY FS (CUSTOM PROCEDURE TRAY) ×2 IMPLANT
PAD CAST 4YDX4 CTTN HI CHSV (CAST SUPPLIES) ×1 IMPLANT
PADDING CAST ABS 4INX4YD NS (CAST SUPPLIES) ×1
PADDING CAST ABS COTTON 4X4 ST (CAST SUPPLIES) ×1 IMPLANT
PADDING CAST COTTON 4X4 STRL (CAST SUPPLIES) ×2
SHEET MEDIUM DRAPE 40X70 STRL (DRAPES) ×2 IMPLANT
SPONGE GAUZE 4X4 12PLY (GAUZE/BANDAGES/DRESSINGS) ×2 IMPLANT
STOCKINETTE 4X48 STRL (DRAPES) ×2 IMPLANT
STRIP CLOSURE SKIN 1/2X4 (GAUZE/BANDAGES/DRESSINGS) IMPLANT
SUT ETHILON 3 0 PS 1 (SUTURE) IMPLANT
SUT ETHILON 5 0 PS 2 18 (SUTURE) IMPLANT
SUT MON AB 4-0 PC3 18 (SUTURE) IMPLANT
SUT PROLENE 3 0 PS 2 (SUTURE) IMPLANT
SUT VIC AB 0 CT3 27 (SUTURE) IMPLANT
SUT VIC AB 3-0 PS1 18 (SUTURE)
SUT VIC AB 3-0 PS1 18XBRD (SUTURE) IMPLANT
SUT VICRYL 4-0 PS2 18IN ABS (SUTURE) IMPLANT
SUT VICRYL RAPIDE 4/0 PS 2 (SUTURE) ×2 IMPLANT
SYR BULB 3OZ (MISCELLANEOUS) ×2 IMPLANT
SYRINGE 10CC LL (SYRINGE) ×2 IMPLANT
UNDERPAD 30X30 INCONTINENT (UNDERPADS AND DIAPERS) ×2 IMPLANT
WATER STERILE IRR 1000ML POUR (IV SOLUTION) ×2 IMPLANT

## 2012-02-21 NOTE — Transfer of Care (Signed)
Immediate Anesthesia Transfer of Care Note  Patient: Jeremiah Alvarez  Procedure(s) Performed: Procedure(s) (LRB): HARDWARE REMOVAL (Left)  Patient Location: PACU  Anesthesia Type: General  Level of Consciousness: sedated  Airway & Oxygen Therapy: Patient Spontanous Breathing and Patient connected to face mask oxygen  Post-op Assessment: Report given to PACU RN and Post -op Vital signs reviewed and stable  Post vital signs: Reviewed and stable  Complications: No apparent anesthesia complications

## 2012-02-21 NOTE — Op Note (Signed)
See dictated note [phone]

## 2012-02-21 NOTE — H&P (Signed)
Jeremiah Alvarez is an 21 y.o. male.   Chief Complaint: right hand pain and swelling HPI: 21 y/o male s/p orif right 4,5 metacarpal fracrures now with hardware pain  History reviewed. No pertinent past medical history.  Past Surgical History  Procedure Date  . Orif finger fracture 09/23/2011    Procedure: OPEN REDUCTION INTERNAL FIXATION (ORIF) METACARPAL (FINGER) FRACTURE;  Surgeon: Marlowe Shores, MD;  Location: [facility];  Service: Orthopedics;  Laterality: Left;  orif left ring and small metacarpal fractures    History reviewed. No pertinent family history. Social History:  reports that he has been smoking Cigarettes.  He has a 5 pack-year smoking history. He has never used smokeless tobacco. He reports that he drinks alcohol. He reports that he does not use illicit drugs.  Allergies: No Known Allergies  Medications Prior to Admission  Medication Dose Route Frequency Provider Last Rate Last Dose  . ceFAZolin (ANCEF) IVPB 1 g/50 mL premix  1 g Intravenous 60 min Pre-Op Marlowe Shores, MD      . chlorhexidine (HIBICLENS) 4 % liquid 4 application  60 mL Topical Once Marlowe Shores, MD      . lactated ringers infusion   Intravenous Continuous Zenon Mayo, MD 10 mL/hr at 02/21/12 1318     No current outpatient prescriptions on file as of 02/21/2012.    No results found for this or any previous visit (from the past 48 hour(s)). No results found.  Review of Systems  All other systems reviewed and are negative.    Blood pressure 103/65, temperature 98.7 F (37.1 C), temperature source Oral, resp. rate 20, height 5\' 7"  (1.702 m), weight 65.772 kg (145 lb), SpO2 98.00%. Physical Exam  Constitutional: He is oriented to person, place, and time. He appears well-developed and well-nourished.  HENT:  Head: Normocephalic and atraumatic.  Cardiovascular: Normal rate.   Respiratory: Effort normal.  Musculoskeletal:       Right hand: He exhibits tenderness and swelling.    Neurological: He is alert and oriented to person, place, and time.  Skin: Skin is warm.  Psychiatric: He has a normal mood and affect. His speech is normal and behavior is normal. Thought content normal.     Assessment/Plan As above  Plan hardware removal  Zephyr Sausedo A 02/21/2012, 2:17 PM

## 2012-02-21 NOTE — Anesthesia Postprocedure Evaluation (Signed)
Anesthesia Post Note  Patient: Jeremiah Alvarez  Procedure(s) Performed: Procedure(s) (LRB): HARDWARE REMOVAL (Left)  Anesthesia type: General  Patient location: PACU  Post pain: Pain level controlled  Post assessment: Patient's Cardiovascular Status Stable  Last Vitals:  Filed Vitals:   02/21/12 1617  BP: 84/44  Pulse: 63  Temp:   Resp: 17    Post vital signs: Reviewed and stable  Level of consciousness: alert  Complications: No apparent anesthesia complications

## 2012-02-21 NOTE — Anesthesia Preprocedure Evaluation (Signed)
Anesthesia Evaluation  Patient identified by MRN, date of birth, ID band Patient awake    Reviewed: Allergy & Precautions, H&P , NPO status , Patient's Chart, lab work & pertinent test results, reviewed documented beta blocker date and time   Airway Mallampati: II TM Distance: >3 FB Neck ROM: full    Dental   Pulmonary neg pulmonary ROS,          Cardiovascular negative cardio ROS      Neuro/Psych negative neurological ROS  negative psych ROS   GI/Hepatic negative GI ROS, Neg liver ROS,   Endo/Other  negative endocrine ROS  Renal/GU negative Renal ROS  negative genitourinary   Musculoskeletal   Abdominal   Peds  Hematology negative hematology ROS (+)   Anesthesia Other Findings See surgeon's H&P   Reproductive/Obstetrics negative OB ROS                           Anesthesia Physical Anesthesia Plan  ASA: I  Anesthesia Plan: General   Post-op Pain Management:    Induction: Intravenous  Airway Management Planned: LMA  Additional Equipment:   Intra-op Plan:   Post-operative Plan: Extubation in OR  Informed Consent: I have reviewed the patients History and Physical, chart, labs and discussed the procedure including the risks, benefits and alternatives for the proposed anesthesia with the patient or authorized representative who has indicated his/her understanding and acceptance.     Plan Discussed with: CRNA and Surgeon  Anesthesia Plan Comments:         Anesthesia Quick Evaluation  

## 2012-02-21 NOTE — Anesthesia Procedure Notes (Signed)
Procedure Name: LMA Insertion Date/Time: 02/21/2012 3:08 PM Performed by: Burna Cash Pre-anesthesia Checklist: Patient identified, Emergency Drugs available, Suction available and Patient being monitored Patient Re-evaluated:Patient Re-evaluated prior to inductionOxygen Delivery Method: Circle System Utilized Preoxygenation: Pre-oxygenation with 100% oxygen Intubation Type: IV induction Ventilation: Mask ventilation without difficulty LMA: LMA inserted LMA Size: 5.0 Number of attempts: 1 Airway Equipment and Method: bite block Placement Confirmation: positive ETCO2 Tube secured with: Tape Dental Injury: Teeth and Oropharynx as per pre-operative assessment

## 2012-02-21 NOTE — Discharge Instructions (Signed)

## 2012-02-21 NOTE — Brief Op Note (Signed)
02/21/2012  3:36 PM  PATIENT:  Jeremiah Alvarez  21 y.o. male  PRE-OPERATIVE DIAGNOSIS:  left hand painful hardware  POST-OPERATIVE DIAGNOSIS:  Left Hand Painful Hardware  PROCEDURE:  Procedure(s) (LRB): HARDWARE REMOVAL (Left)  SURGEON:  Surgeon(s) and Role:    * Marlowe Shores, MD - Primary  PHYSICIAN ASSISTANT:   ASSISTANTS: none   ANESTHESIA:   general  EBL:  Total I/O In: 1000 [I.V.:1000] Out: -   BLOOD ADMINISTERED:none  DRAINS: none   LOCAL MEDICATIONS USED:  MARCAINE   5cc  SPECIMEN:  No Specimen  DISPOSITION OF SPECIMEN:  N/A  COUNTS:  YES  TOURNIQUET:   Total Tourniquet Time Documented: area (laterality) - 11 minutes  DICTATION: .Other Dictation: Dictation Number [id number]  PLAN OF CARE: Discharge to home after PACU  PATIENT DISPOSITION:  PACU - hemodynamically stable.   Delay start of Pharmacological VTE agent (>24hrs) due to surgical blood loss or risk of bleeding: not applicable

## 2012-02-22 NOTE — Op Note (Signed)
NAMESHEFFIELD, HAWKER            ACCOUNT NO.:  [id number]  MEDICAL RECORD NO.:  [id number]  LOCATION:  WTR6                         FACILITY:  [facility]  PHYSICIAN:  Artist Pais. Linetta Regner, M.D.DATE OF BIRTH:  Jun 27, 1991  DATE OF PROCEDURE:  02/21/2012 DATE OF DISCHARGE:  02/21/2012                              OPERATIVE REPORT   PREOPERATIVE DIAGNOSIS:  Retained painful hardware, right hand.  POSTOPERATIVE DIAGNOSIS:  Retained painful hardware, right hand.  PROCEDURE:  Total removal of extensor tenolysis, right hand.  SURGEON:  Artist Pais. Mina Marble, MD  ASSISTANT:  None.  ANESTHESIA:  General.  TOURNIQUET TIME:  11 minute  COMPLICATIONS:  No complication.  DRAINS:  No drains.  DESCRIPTION OF PROCEDURE:  The patient was taken to the operating suite. After induction of general anesthesia, right upper extremity prepped and draped in sterile fashion.  Esmarch was used to exsanguinate the limb. Tourniquet was inflated to 250 mmHg.  At this point in time, an L-shaped incision was made to the dorsal aspect of the right hand along the ulnar border using his previous incision.  Skin was incised.  The proximal based flap was elevated.  Dissection was carried down the base of the ring and small metacarpals.  EDC and EDQ tendons were lysed all adhesions.  There retracted the IM rod was removed without difficulty. There was a small bursa around the each rod and this was carefully resected.  The wound was thoroughly irrigated, loosely closed with 4-0 Vicryl Rapide suture.  Xeroform, 4x4s, fluffs, and compressive hand dressing was applied.  The patient tolerated the procedure well and went to recovery room in stable.     Artist Pais Mina Marble, M.D.     MAW/MEDQ  D:  02/21/2012  T:  02/22/2012  Job:  [id number]

## 2012-02-24 ENCOUNTER — Encounter (HOSPITAL_BASED_OUTPATIENT_CLINIC_OR_DEPARTMENT_OTHER): Payer: Self-pay | Admitting: Orthopedic Surgery

## 2012-02-24 LAB — POCT HEMOGLOBIN-HEMACUE: Hemoglobin: 15.3 g/dL (ref 13.0–17.0)

## 2012-11-12 ENCOUNTER — Encounter (HOSPITAL_COMMUNITY): Payer: Self-pay | Admitting: Emergency Medicine

## 2012-11-12 ENCOUNTER — Emergency Department (HOSPITAL_COMMUNITY): Payer: No Typology Code available for payment source

## 2012-11-12 ENCOUNTER — Emergency Department (HOSPITAL_COMMUNITY)
Admission: EM | Admit: 2012-11-12 | Discharge: 2012-11-12 | Disposition: A | Discharge status: Home / Self Care | Payer: No Typology Code available for payment source | Attending: Emergency Medicine | Admitting: Emergency Medicine

## 2012-11-12 DIAGNOSIS — M542 Cervicalgia: Secondary | ICD-10-CM | POA: Insufficient documentation

## 2012-11-12 DIAGNOSIS — R079 Chest pain, unspecified: Secondary | ICD-10-CM | POA: Insufficient documentation

## 2012-11-12 DIAGNOSIS — IMO0002 Reserved for concepts with insufficient information to code with codable children: Secondary | ICD-10-CM | POA: Insufficient documentation

## 2012-11-12 DIAGNOSIS — F17200 Nicotine dependence, unspecified, uncomplicated: Secondary | ICD-10-CM | POA: Insufficient documentation

## 2012-11-12 DIAGNOSIS — Y92410 Unspecified street and highway as the place of occurrence of the external cause: Secondary | ICD-10-CM | POA: Insufficient documentation

## 2012-11-12 DIAGNOSIS — R040 Epistaxis: Secondary | ICD-10-CM | POA: Insufficient documentation

## 2012-11-12 DIAGNOSIS — Y9389 Activity, other specified: Secondary | ICD-10-CM | POA: Insufficient documentation

## 2012-11-12 DIAGNOSIS — S0990XA Unspecified injury of head, initial encounter: Secondary | ICD-10-CM | POA: Insufficient documentation

## 2012-11-12 DIAGNOSIS — S022XXA Fracture of nasal bones, initial encounter for closed fracture: Secondary | ICD-10-CM | POA: Insufficient documentation

## 2012-11-12 MED ORDER — OXYCODONE-ACETAMINOPHEN 5-325 MG PO TABS: 1.0000 | ORAL_TABLET | Freq: Once | ORAL | Status: DC

## 2012-11-12 MED ORDER — OXYCODONE-ACETAMINOPHEN 5-325 MG PO TABS
1.0000 | ORAL_TABLET | Freq: Once | ORAL | Status: AC
  Administered 2012-11-12: 1 via ORAL
  Filled 2012-11-12: qty 1
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
## 2012-11-12 NOTE — ED Notes (Signed)
Pt back from radiology 
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
## 2012-11-13 NOTE — ED Provider Notes (Signed)
Medical screening examination/treatment/procedure(s) were performed by non-physician practitioner and as supervising physician I was immediately available for consultation/collaboration.  Tobin Chad, MD 11/13/12 [phone]

## 2013-07-06 ENCOUNTER — Emergency Department (HOSPITAL_COMMUNITY): Payer: Self-pay

## 2013-07-06 ENCOUNTER — Emergency Department (HOSPITAL_COMMUNITY)
Admission: EM | Admit: 2013-07-06 | Discharge: 2013-07-06 | Disposition: A | Discharge status: Home / Self Care | Payer: Self-pay | Attending: Emergency Medicine | Admitting: Emergency Medicine

## 2013-07-06 ENCOUNTER — Encounter (HOSPITAL_COMMUNITY): Payer: Self-pay | Admitting: *Deleted

## 2013-07-06 DIAGNOSIS — Y9389 Activity, other specified: Secondary | ICD-10-CM | POA: Insufficient documentation

## 2013-07-06 DIAGNOSIS — S6991XA Unspecified injury of right wrist, hand and finger(s), initial encounter: Secondary | ICD-10-CM

## 2013-07-06 DIAGNOSIS — S6990XA Unspecified injury of unspecified wrist, hand and finger(s), initial encounter: Secondary | ICD-10-CM | POA: Insufficient documentation

## 2013-07-06 DIAGNOSIS — F17200 Nicotine dependence, unspecified, uncomplicated: Secondary | ICD-10-CM | POA: Insufficient documentation

## 2013-07-06 DIAGNOSIS — X58XXXA Exposure to other specified factors, initial encounter: Secondary | ICD-10-CM | POA: Insufficient documentation

## 2013-07-06 DIAGNOSIS — Y929 Unspecified place or not applicable: Secondary | ICD-10-CM | POA: Insufficient documentation

## 2013-07-06 DIAGNOSIS — Z9889 Other specified postprocedural states: Secondary | ICD-10-CM | POA: Insufficient documentation

## 2013-07-06 MED ORDER — NAPROXEN 500 MG PO TABS: 500.0000 mg | ORAL_TABLET | Freq: Two times a day (BID) | ORAL | Status: DC

## 2013-07-06 NOTE — ED Notes (Signed)
Patient transported to X-ray 

## 2013-07-06 NOTE — ED Provider Notes (Signed)
Medical screening examination/treatment/procedure(s) were performed by non-physician practitioner and as supervising physician I was immediately available for consultation/collaboration.   Gilda Crease, MD 07/06/13 [phone]

## 2013-07-06 NOTE — ED Provider Notes (Signed)
CSN: [id number]     Arrival date & time 07/06/13  4401 History   First MD Initiated Contact with Patient 07/06/13 [phone]     Chief Complaint  Patient presents with  . Hand Injury   (Consider location/radiation/quality/duration/timing/severity/associated sxs/prior Treatment) HPI Comments: Patient is a 22 year old male who presents with right hand pain since last night. The pain started suddenly when he was trying to break up a dog fight. The pain is throbbing and severe without radiation. Patient reports associated swelling and bruising. Movement and palpation makes the pain worse. Nothing makes the pain better. Patient did not try anything for pain. Patient denies any other injuries.    History reviewed. No pertinent past medical history. Past Surgical History  Procedure Laterality Date  . Orif finger fracture  09/23/2011    Procedure: OPEN REDUCTION INTERNAL FIXATION (ORIF) METACARPAL (FINGER) FRACTURE;  Surgeon: Marlowe Shores, MD;  Location: [facility];  Service: Orthopedics;  Laterality: Left;  orif left ring and small metacarpal fractures  . Hardware removal  02/21/2012    Procedure: HARDWARE REMOVAL;  Surgeon: Marlowe Shores, MD;  Location: [facility];  Service: Orthopedics;  Laterality: Left;   No family history on file. History  Substance Use Topics  . Smoking status: Current Every Day Smoker -- 0.50 packs/day for 10 years    Types: Cigarettes  . Smokeless tobacco: Never Used  . Alcohol Use: Yes     Comment: OCCAIS    Review of Systems  Musculoskeletal: Positive for joint swelling and arthralgias.  All other systems reviewed and are negative.    Allergies  Review of patient's allergies indicates no known allergies.  Home Medications   Current Outpatient Rx  Name  Route  Sig  Dispense  Refill  . Esomeprazole Magnesium (NEXIUM 24HR PO)   Oral   Take 1 tablet by mouth daily as needed (heartburn).          BP 103/67  Pulse 68  Temp(Src) 97 F (36.1  C) (Oral)  Resp 20  Ht 5\' 7"  (1.702 m)  Wt 140 lb (63.504 kg)  BMI 21.92 kg/m2  SpO2 98% Physical Exam  Nursing note and vitals reviewed. Constitutional: He is oriented to person, place, and time. He appears well-developed and well-nourished. No distress.  HENT:  Head: Normocephalic and atraumatic.  Eyes: Conjunctivae are normal.  Neck: Normal range of motion.  Cardiovascular: Normal rate and regular rhythm.  Exam reveals no gallop and no friction rub.   No murmur heard. Sufficient capillary refill of distal phalanges.   Pulmonary/Chest: Effort normal and breath sounds normal. He has no wheezes. He has no rales. He exhibits no tenderness.  Musculoskeletal: Normal range of motion.  Tenderness to palpation of 3rd metacarpal bone of right hand with associated edema. No obvious deformity. ROM intact of right hand.   Neurological: He is alert and oriented to person, place, and time. Coordination normal.  Sensation of upper extremity intact and equal bilaterally. Speech is goal-oriented. Moves limbs without ataxia.   Skin: Skin is warm and dry.  Psychiatric: He has a normal mood and affect. His behavior is normal.    ED Course  Procedures (including critical care time) Labs Review Labs Reviewed - No data to display Imaging Review Dg Hand Complete Right  07/06/2013   *RADIOLOGY REPORT*  Clinical Data: Right hand injury, pain.  RIGHT HAND - COMPLETE 3+ VIEW  Comparison: None.  Findings: Imaged bones, joints and soft tissues appear normal.  IMPRESSION: Negative exam.   Original Report Authenticated By: Holley Dexter, M.D.    MDM   1. Hand injury, right, initial encounter     8:47 AM Right hand xray pending. Vitals stable and patient afebrile.   9:31 AM Xray unremarkable for acute changes. No neurovascular compromise. No further evaluation needed at this time. Patient instructed to ice and elevate injury.    Emilia Beck, [place] 07/06/13 [phone]

## 2013-07-06 NOTE — ED Notes (Signed)
C/o right hand pain after punching something last night

## 2013-10-07 IMAGING — CR DG HAND COMPLETE 3+V*L*
3 series · 3 of 3 positions shown · non-contrast
Comparison: None.

CLINICAL DATA: Left hand trauma, pain and swelling

LEFT HAND - COMPLETE 3+ VIEW

[x hand pa left]
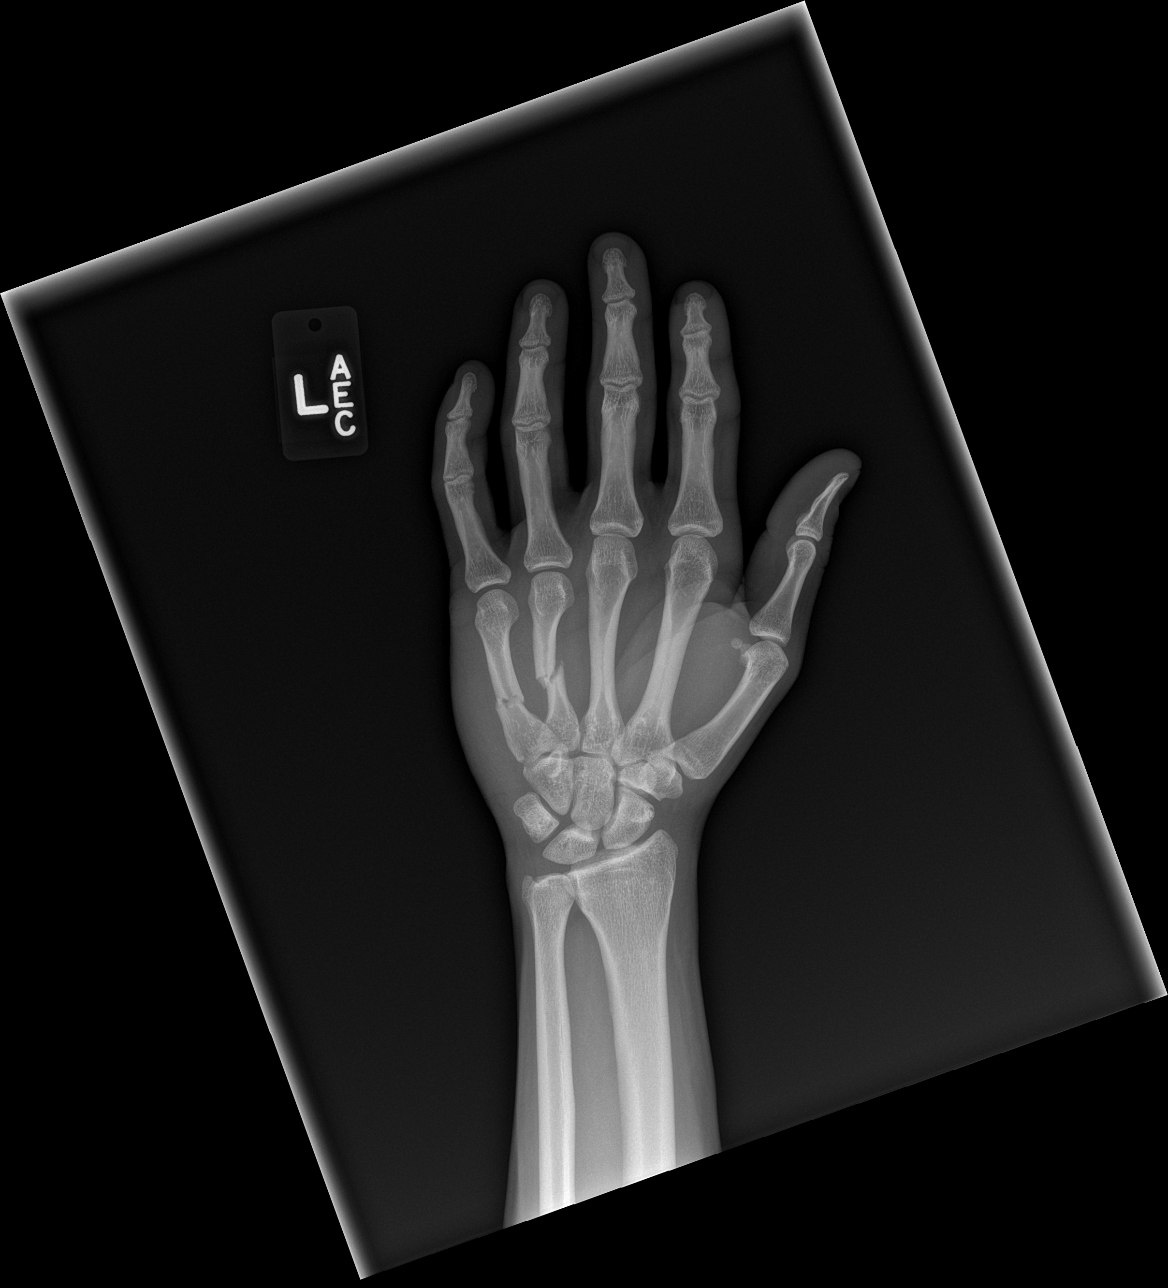

[x hand obl left]
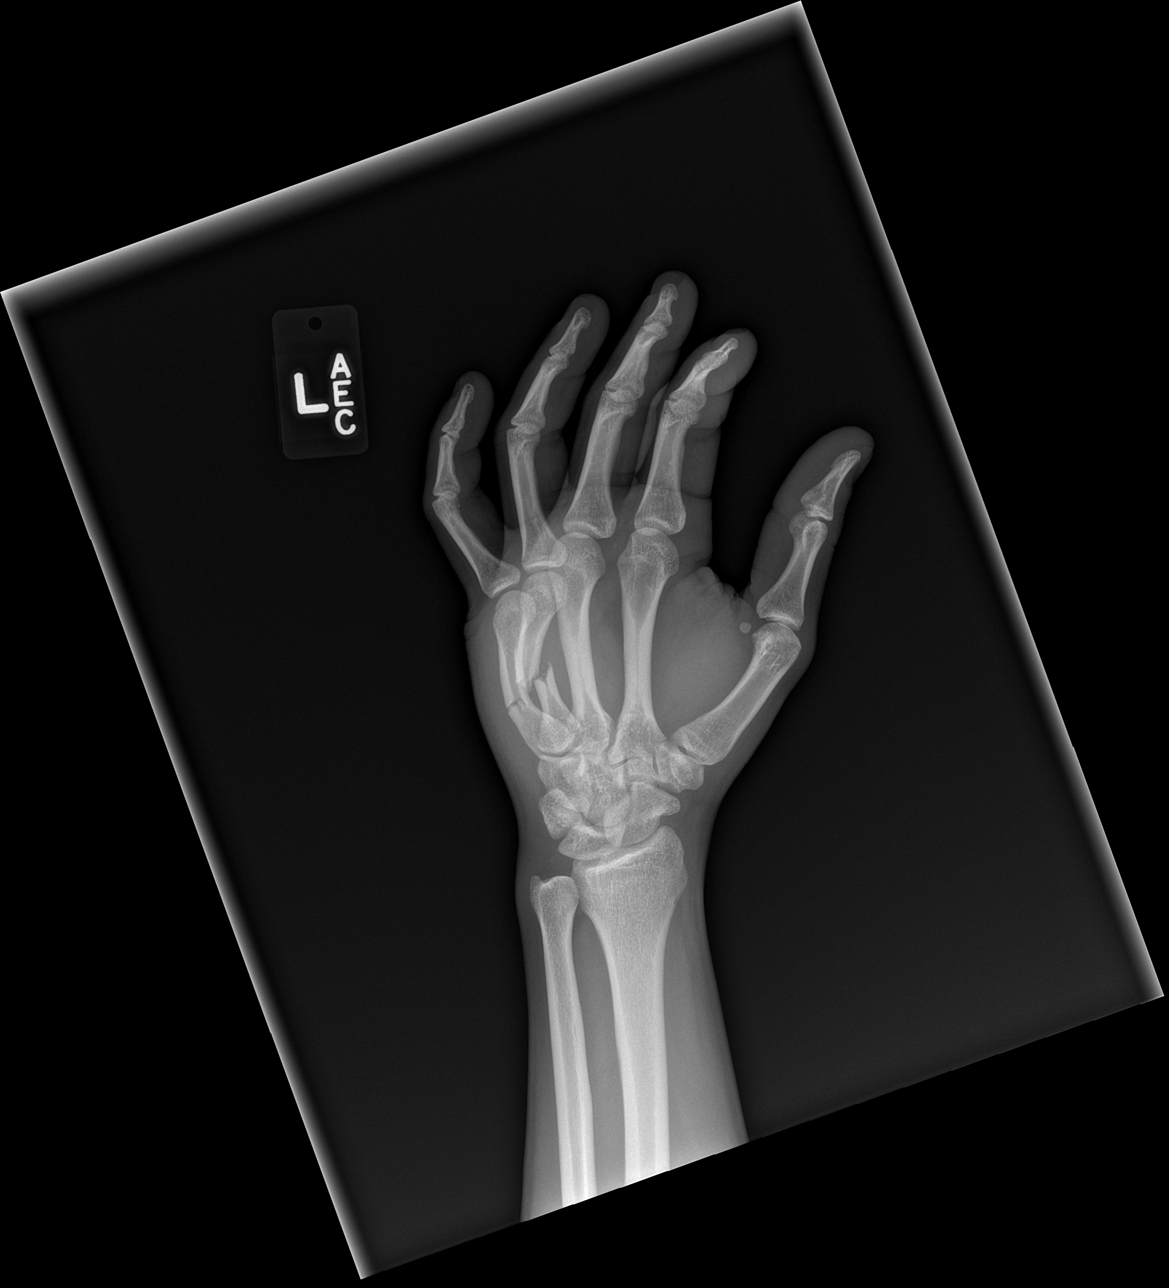

[x hand lat left]
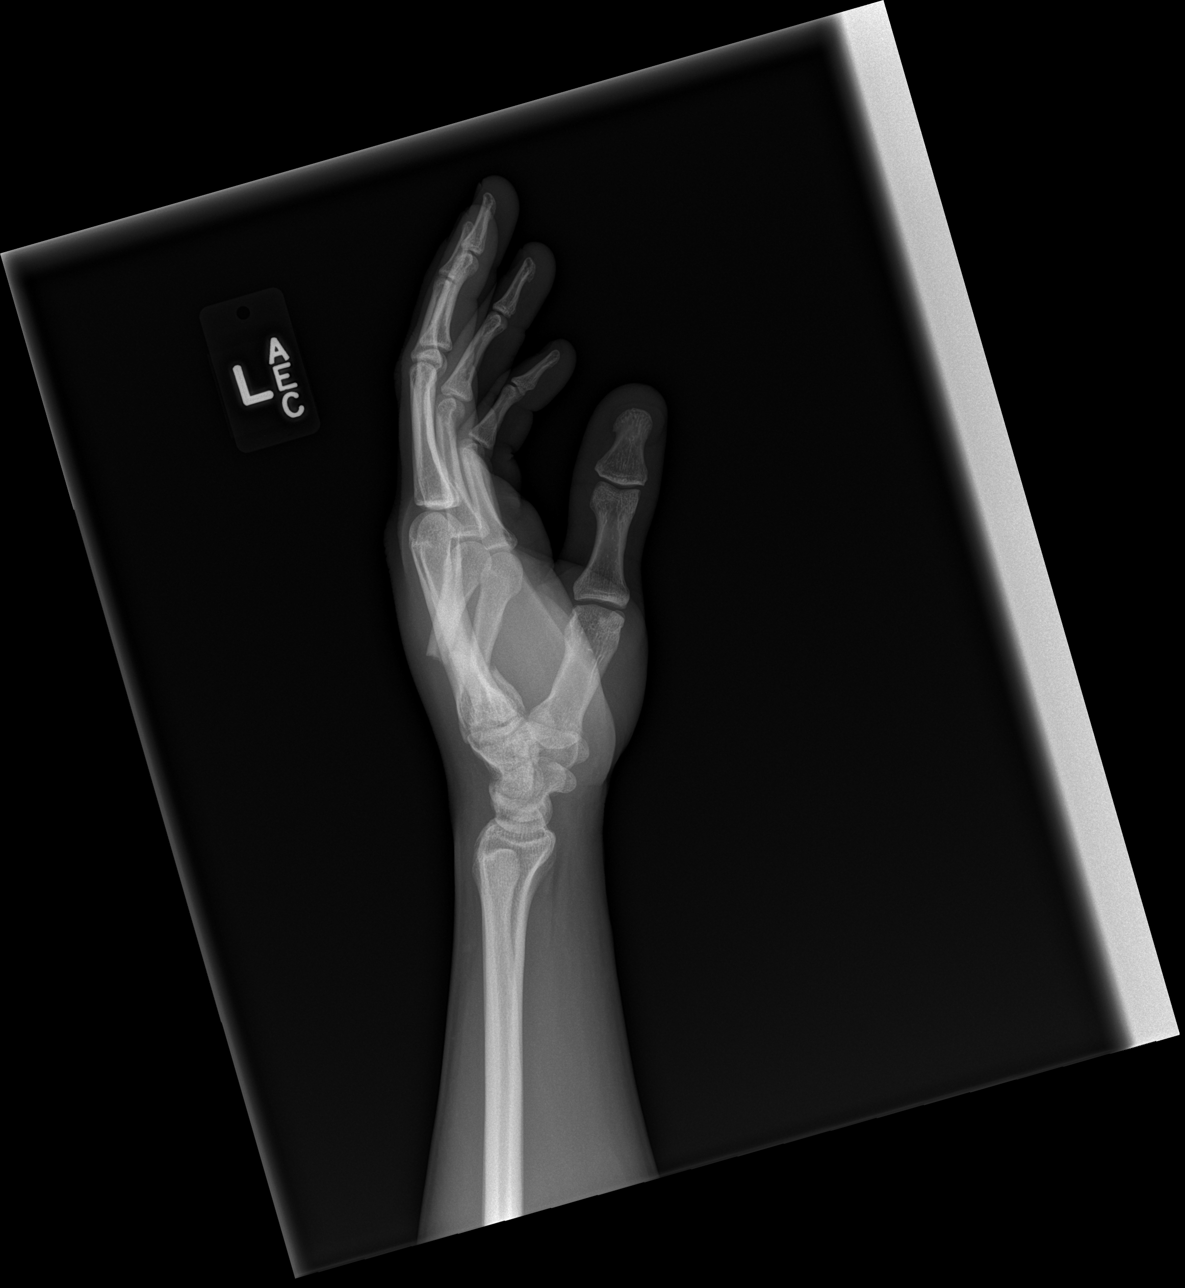

[3 of 3 positions shown; findings below may reference images not displayed]

FINDINGS: There are transverse fractures through the mid shaft left
fourth and fifth metacarpals, with apex dorsal angulation of the
fracture fragments.  Overlying soft tissue swelling is noted.
There is mild comminution.  No radiopaque foreign body.
IMPRESSION: Mid shaft left fourth and fifth metacarpal fractures.

## 2014-03-20 ENCOUNTER — Emergency Department (HOSPITAL_COMMUNITY)
Admission: EM | Admit: 2014-03-20 | Discharge: 2014-03-20 | Disposition: A | Discharge status: Home / Self Care | Payer: Self-pay | Attending: Emergency Medicine | Admitting: Emergency Medicine

## 2014-03-20 ENCOUNTER — Emergency Department (HOSPITAL_COMMUNITY): Payer: Self-pay

## 2014-03-20 ENCOUNTER — Encounter (HOSPITAL_COMMUNITY): Payer: Self-pay | Admitting: Emergency Medicine

## 2014-03-20 DIAGNOSIS — N503 Cyst of epididymis: Secondary | ICD-10-CM

## 2014-03-20 DIAGNOSIS — Z79899 Other long term (current) drug therapy: Secondary | ICD-10-CM | POA: Insufficient documentation

## 2014-03-20 DIAGNOSIS — R634 Abnormal weight loss: Secondary | ICD-10-CM | POA: Insufficient documentation

## 2014-03-20 DIAGNOSIS — F17200 Nicotine dependence, unspecified, uncomplicated: Secondary | ICD-10-CM | POA: Insufficient documentation

## 2014-03-20 DIAGNOSIS — Z791 Long term (current) use of non-steroidal anti-inflammatories (NSAID): Secondary | ICD-10-CM | POA: Insufficient documentation

## 2014-03-20 DIAGNOSIS — N508 Other specified disorders of male genital organs: Secondary | ICD-10-CM | POA: Insufficient documentation

## 2014-03-20 LAB — PROTIME-INR
INR: 0.93 (ref 0.00–1.49)
PROTHROMBIN TIME: 12.3 s (ref 11.6–15.2)

## 2014-03-20 LAB — URINALYSIS, ROUTINE W REFLEX MICROSCOPIC
BILIRUBIN URINE: NEGATIVE
GLUCOSE, UA: NEGATIVE mg/dL
HGB URINE DIPSTICK: NEGATIVE
KETONES UR: NEGATIVE mg/dL
Leukocytes, UA: NEGATIVE
Nitrite: NEGATIVE
PH: 5.5 (ref 5.0–8.0)
PROTEIN: NEGATIVE mg/dL
SPECIFIC GRAVITY, URINE: 1.026 (ref 1.005–1.030)
UROBILINOGEN UA: 1 mg/dL (ref 0.0–1.0)

## 2014-03-20 LAB — CBC WITH DIFFERENTIAL/PLATELET
BASOS ABS: 0 10*3/uL (ref 0.0–0.1)
Basophils Relative: 0 % (ref 0–1)
EOS ABS: 0.2 10*3/uL (ref 0.0–0.7)
EOS PCT: 2 % (ref 0–5)
HEMATOCRIT: 40.2 % (ref 39.0–52.0)
Hemoglobin: 14.6 g/dL (ref 13.0–17.0)
LYMPHS PCT: 40 % (ref 12–46)
Lymphs Abs: 3.1 10*3/uL (ref 0.7–4.0)
MCH: 32.1 pg (ref 26.0–34.0)
MCHC: 36.3 g/dL — AB (ref 30.0–36.0)
MCV: 88.4 fL (ref 78.0–100.0)
MONO ABS: 0.5 10*3/uL (ref 0.1–1.0)
Monocytes Relative: 7 % (ref 3–12)
Neutro Abs: 3.9 10*3/uL (ref 1.7–7.7)
Neutrophils Relative %: 51 % (ref 43–77)
PLATELETS: 225 10*3/uL (ref 150–400)
RBC: 4.55 MIL/uL (ref 4.22–5.81)
RDW: 12.7 % (ref 11.5–15.5)
WBC: 7.7 10*3/uL (ref 4.0–10.5)

## 2014-03-20 LAB — BASIC METABOLIC PANEL
BUN: 13 mg/dL (ref 6–23)
CALCIUM: 9.2 mg/dL (ref 8.4–10.5)
CO2: 24 meq/L (ref 19–32)
CREATININE: 0.86 mg/dL (ref 0.50–1.35)
Chloride: 105 mEq/L (ref 96–112)
GFR calc Af Amer: >90 mL/min (ref >90)
Glucose, Bld: 80 mg/dL (ref 70–99)
Potassium: 4.1 mEq/L (ref 3.7–5.3)
Sodium: 142 mEq/L (ref 137–147)

## 2014-03-20 LAB — APTT: APTT: 33 s (ref 24–37)

## 2014-03-20 MED ORDER — HYDROCODONE-ACETAMINOPHEN 5-325 MG PO TABS: ORAL_TABLET | ORAL | Status: DC

## 2014-03-20 MED ORDER — IBUPROFEN 800 MG PO TABS
800.0000 mg | ORAL_TABLET | Freq: Once | ORAL | Status: AC
  Administered 2014-03-20: 800 mg via ORAL
  Filled 2014-03-20: qty 1

## 2014-03-20 MED ORDER — HYDROCODONE-ACETAMINOPHEN 5-325 MG PO TABS
2.0000 | ORAL_TABLET | Freq: Once | ORAL | Status: AC
  Administered 2014-03-20: 2 via ORAL
  Filled 2014-03-20: qty 2

## 2014-03-20 NOTE — Discharge Instructions (Signed)
For pain control please take ibuprofen (also known as Motrin or Advil) 800mg  (this is normally 4 over the counter pills) 3 times a day  for 5 days. Take with food to minimize stomach irritation.  Take vicodin for breakthrough pain, do not drink alcohol, drive, care for children or do other critical tasks while taking vicodin.   Do not hesitate to return to the Emergency Department for any new, worsening or concerning symptoms.   If you do not have a primary care doctor you can establish one at the   [facility]: [address] [place] [id number] [phone]  After you establish care. Let them know you were seen in the emergency room. They must obtain records for further management.   An epididymal cyst is a smooth, fluid-filled lump found on the testicles. Although some men may worry that they are a sign of cancer or another serious problem, epididymal cysts are in fact benign and thus not a health risk.  The cysts form when fluid builds up in a tube behind the testicles called the epididymis, which stores and transports sperm. It is possible to have more than one cyst at a time and they can appear on both testicles. Up to a third of men are affected by this condition and the cysts can develop at any time in life, although it is most likely to occur in those who are middle-aged.  Epididymal cysts can be very small, but some can grow to the same size as a testicle or even as large as a grapefruit! There are normally no symptoms unless a cyst becomes infected, in which case it may drain fluid and become painful or swollen. Larger epididymal cysts can cause some discomfort, which is usually the reason why some men opt to have them removed. This procedure involves a simple operation to cut the cyst out of the epididymis. However, if a cyst is not infected and does not cause any pain or discomfort, it is not necessary to treat it.  Although epididymal cysts are benign, it is  still important to get any new or unusual lumps and bumps in your genital area checked out by a GP.

## 2014-03-20 NOTE — ED Notes (Addendum)
Pt c/o "knots" to testicle area x 6 months. Pt has not been seen by a Dr for this complaint. Pt reports bright blood and semen discharge. Pt C/o low back pain also. Pt reports weight loss and unable to gain weight. Pt loss 10 lbs in 6 months.

## 2014-03-20 NOTE — ED Notes (Signed)
Pt states he is been having testicle pain since last November, he never when to see the doctor, but now the pain is increasing and is going all the way to his back pt refers is "like stabbing pain 9/10 all around his testicles and on his lower back, pt refers is having burning sensation and some blood on his urine, he is having some breakout of sweat and chills frequently, he feels that he is having some tender nodules around his testicles. He states he is having frequent dark loose stool for a month now.

## 2014-03-20 NOTE — ED Provider Notes (Signed)
CSN: [id number]     Arrival date & time 03/20/14  1116 History   First MD Initiated Contact with Patient 03/20/14 1126     Chief Complaint  Patient presents with  . Testicle Pain  . Back Pain     (Consider location/radiation/quality/duration/timing/severity/associated sxs/prior Treatment) HPI  Velva HarmanChristian Groseclose is a 23 y.o. male complaining of painful swelling to bilateral proximal testicles going on for 6 weeks. Patient also reports hematuria for a month and blood in semen starting last week. Denies dysuria, thick urethral discharge. Patient has unexplained 10 pound weight loss loss in the last month when patient has actually been trying to gain weight. He endorses subjective fever and chills, easy bruising and bleeding. Patient states he works as a [employer] and when his hands get cut they tend to take a long time to stop bleeding. Patient is uninsured, [place]'t [place] the [place] since [place] from [place] recently. Patient denies headache, dizziness, chest pain, shortness of breath, abd pain, nausea vomiting.  History reviewed. No pertinent past medical history. Past Surgical History  Procedure Laterality Date  . Orif finger fracture  09/23/2011    Procedure: OPEN REDUCTION INTERNAL FIXATION (ORIF) METACARPAL (FINGER) FRACTURE;  Surgeon: Marlowe ShoresMatthew A Weingold, MD;  Location: [facility];  Service: Orthopedics;  Laterality: Left;  orif left ring and small metacarpal fractures  . Hardware removal  02/21/2012    Procedure: HARDWARE REMOVAL;  Surgeon: Marlowe ShoresMatthew A Weingold, MD;  Location: [facility];  Service: Orthopedics;  Laterality: Left;   History reviewed. No pertinent family history. History  Substance Use Topics  . Smoking status: Current Every Day Smoker -- 0.50 packs/day for 10 years    Types: Cigarettes  . Smokeless tobacco: Current User  . Alcohol Use: Yes     Comment: OCCAIS    Review of Systems  10 systems reviewed and found to be negative, except as  noted in the HPI.  Allergies  Review of patient's allergies indicates no known allergies.  Home Medications   Prior to Admission medications   Medication Sig Start Date End Date Taking? Authorizing Provider  Esomeprazole Magnesium (NEXIUM 24HR PO) Take 1 tablet by mouth daily as needed (heartburn).    Historical Provider, MD  naproxen (NAPROSYN) 500 MG tablet Take 1 tablet (500 mg total) by mouth 2 (two) times daily with a meal. 07/06/13   Kaitlyn Szekalski, PA-C   BP 113/73  Pulse 61  Temp(Src) 97.8 F (36.6 C) (Oral)  Resp 19  Ht 5\' 7"  (1.702 m)  Wt 146 lb (66.225 kg)  BMI 22.86 kg/m2  SpO2 97% Physical Exam  Nursing note and vitals reviewed. Constitutional: He is oriented to person, place, and time. He appears well-developed and well-nourished. No distress.  HENT:  Head: Normocephalic.  Mouth/Throat: Oropharynx is clear and moist.  Eyes: Conjunctivae and EOM are normal. Pupils are equal, round, and reactive to light.  Neck: Normal range of motion.  Cardiovascular: Normal rate, regular rhythm and intact distal pulses.   Pulmonary/Chest: Effort normal and breath sounds normal. No stridor. No respiratory distress. He has no wheezes. He has no rales. He exhibits no tenderness.  Abdominal: Soft. Bowel sounds are normal. He exhibits no distension and no mass. There is no tenderness. There is no rebound and no guarding.  Genitourinary:  Exam chaperoned by nurse: No testicular swelling, there is mild enlargement of the area where the venous plexus is proximal to the scrotum. He is tender to palpation. There is  no urethral discharge no rash.  Musculoskeletal: Normal range of motion.  Neurological: He is alert and oriented to person, place, and time.  Psychiatric: He has a normal mood and affect.    ED Course  Procedures (including critical care time) Labs Review Labs Reviewed  CBC WITH DIFFERENTIAL - Abnormal; Notable for the following:    MCHC 36.3 (*)    All other components  within normal limits  GC/CHLAMYDIA PROBE AMP  URINALYSIS, ROUTINE W REFLEX MICROSCOPIC  BASIC METABOLIC PANEL  PROTIME-INR  APTT    Imaging Review [place] Scrotum  03/20/2014   CLINICAL DATA:  Right testicular pain for 6 months  EXAM: SCROTAL ULTRASOUND  DOPPLER ULTRASOUND OF THE TESTICLES  TECHNIQUE: Complete ultrasound examination of the testicles, epididymis, and other scrotal structures was performed. Color and spectral Doppler ultrasound were also utilized to evaluate blood flow to the testicles.  COMPARISON:  None.  FINDINGS: Right testicle  Measurements: 4.1 x 2.7 x 1.8 cm. No mass or microlithiasis visualized.  Left testicle  Measurements: 4.3 x 3.0 x 2.0 cm. No mass or microlithiasis visualized.  Right epididymis: Simple appearing cyst measures 1.1 x 0.8 x 0.8 cm. This corresponds to the area of focal pain/mass as indicated by the patient.  Left epididymis:  Simple appearing cyst measures 0.6 x 0.5 x 0.5 cm.  Hydrocele:  Small bilateral hydroceles are noted.  Varicocele:  Small left varicocele.  Pulsed Doppler interrogation of both testes demonstrates low resistance arterial and venous waveforms bilaterally.  IMPRESSION: Normal exam. No intratesticular mass or evidence for testicular torsion.  Bilateral simple appearing epididymal cysts incidentally noted, of which the one on the right corresponds to the area of focal pain and palpable mass as indicated by the patient.   Electronically Signed   By: Christiana PellantGretchen  Green M.D.   On: 03/20/2014 13:08   [place] Art/ven Flow Abd Pelv Doppler  03/20/2014   CLINICAL DATA:  Right testicular pain for 6 months  EXAM: SCROTAL ULTRASOUND  DOPPLER ULTRASOUND OF THE TESTICLES  TECHNIQUE: Complete ultrasound examination of the testicles, epididymis, and other scrotal structures was performed. Color and spectral Doppler ultrasound were also utilized to evaluate blood flow to the testicles.  COMPARISON:  None.  FINDINGS: Right testicle  Measurements: 4.1 x 2.7 x 1.8 cm. No  mass or microlithiasis visualized.  Left testicle  Measurements: 4.3 x 3.0 x 2.0 cm. No mass or microlithiasis visualized.  Right epididymis: Simple appearing cyst measures 1.1 x 0.8 x 0.8 cm. This corresponds to the area of focal pain/mass as indicated by the patient.  Left epididymis:  Simple appearing cyst measures 0.6 x 0.5 x 0.5 cm.  Hydrocele:  Small bilateral hydroceles are noted.  Varicocele:  Small left varicocele.  Pulsed Doppler interrogation of both testes demonstrates low resistance arterial and venous waveforms bilaterally.  IMPRESSION: Normal exam. No intratesticular mass or evidence for testicular torsion.  Bilateral simple appearing epididymal cysts incidentally noted, of which the one on the right corresponds to the area of focal pain and palpable mass as indicated by the patient.   Electronically Signed   By: Christiana PellantGretchen  Green M.D.   On: 03/20/2014 13:08     EKG Interpretation None      MDM   Final diagnoses:  Epididymal cyst  Unexplained weight loss   Filed Vitals:   03/20/14 1200 03/20/14 1415 03/20/14 1430 03/20/14 1445  BP: 102/61 79/37 97/54  113/73  Pulse: 60 54 58 61  Temp:      TempSrc:  Resp:      Height:      Weight:      SpO2: 97% 100% 99% 97%    Medications  HYDROcodone-acetaminophen (NORCO/VICODIN) 5-325 MG per tablet 2 tablet (2 tablets Oral Given 03/20/14 1401)  ibuprofen (ADVIL,MOTRIN) tablet 800 mg (800 mg Oral Given 03/20/14 1509)    Montgomery Favor is a 23 y.o. male presenting with intermittent testicular pain for 6 weeks, unexplained weight  loss, subjective fever or chills and using bleeding. Patient has not had outpatient care. Ultrasound shows no acute abnormality but notes several epididymal cyst. Bloodwork and UA reassuring. GC chlamydia pending. Recommend urology and primary care followup.  Evaluation does not show pathology that would require ongoing emergent intervention or inpatient treatment. Pt is hemodynamically stable and mentating  appropriately. Discussed findings and plan with patient/guardian, who agrees with care plan. All questions answered. Return precautions discussed and outpatient follow up given.   New Prescriptions   HYDROCODONE-ACETAMINOPHEN (NORCO/VICODIN) 5-325 MG PER TABLET    Take 1-2 tablets by mouth every 6 hours as needed for pain.    Note: Portions of this report may have been transcribed using voice recognition software. Every effort was made to ensure accuracy; however, inadvertent computerized transcription errors may be present     Wynetta Emery, PA-C 03/20/14 1523

## 2014-03-20 NOTE — ED Notes (Signed)
Patient still at US. Family given drink.

## 2014-03-21 LAB — GC/CHLAMYDIA PROBE AMP
CT Probe RNA: NEGATIVE
GC Probe RNA: NEGATIVE

## 2014-03-22 NOTE — ED Provider Notes (Signed)
Medical screening examination/treatment/procedure(s) were performed by non-physician practitioner and as supervising physician I was immediately available for consultation/collaboration.   EKG Interpretation None       Shelda JakesScott W. Kathalene Sporer, MD 03/22/14 [phone]

## 2014-12-25 ENCOUNTER — Emergency Department: Payer: Self-pay | Admitting: Family Medicine

## 2015-02-10 ENCOUNTER — Emergency Department
Admit: 2015-02-10 | Disposition: A | Discharge status: Home / Self Care | Payer: Self-pay | Admitting: Emergency Medicine

## 2015-04-12 ENCOUNTER — Emergency Department
Admission: EM | Admit: 2015-04-12 | Discharge: 2015-04-12 | Disposition: A | Discharge status: Home / Self Care | Payer: Self-pay | Attending: Emergency Medicine | Admitting: Emergency Medicine

## 2015-04-12 ENCOUNTER — Encounter: Payer: Self-pay | Admitting: Emergency Medicine

## 2015-04-12 DIAGNOSIS — H6121 Impacted cerumen, right ear: Secondary | ICD-10-CM | POA: Insufficient documentation

## 2015-04-12 DIAGNOSIS — S41112A Laceration without foreign body of left upper arm, initial encounter: Secondary | ICD-10-CM

## 2015-04-12 DIAGNOSIS — S51812A Laceration without foreign body of left forearm, initial encounter: Secondary | ICD-10-CM | POA: Insufficient documentation

## 2015-04-12 DIAGNOSIS — Y9389 Activity, other specified: Secondary | ICD-10-CM | POA: Insufficient documentation

## 2015-04-12 DIAGNOSIS — Z720 Tobacco use: Secondary | ICD-10-CM | POA: Insufficient documentation

## 2015-04-12 DIAGNOSIS — Y288XXA Contact with other sharp object, undetermined intent, initial encounter: Secondary | ICD-10-CM | POA: Insufficient documentation

## 2015-04-12 DIAGNOSIS — Y9289 Other specified places as the place of occurrence of the external cause: Secondary | ICD-10-CM | POA: Insufficient documentation

## 2015-04-12 DIAGNOSIS — Y990 Civilian activity done for income or pay: Secondary | ICD-10-CM | POA: Insufficient documentation

## 2015-04-12 MED ORDER — LIDOCAINE-EPINEPHRINE (PF) 1 %-1:200000 IJ SOLN
30.0000 mL | Freq: Once | INTRAMUSCULAR | Status: AC
  Administered 2015-04-12: 30 mL via INTRADERMAL

## 2015-04-12 MED ORDER — NEOMYCIN-POLYMYXIN-HC 3.5-10000-1 OT SOLN: 3.0000 [drp] | Freq: Three times a day (TID) | OTIC | Status: AC

## 2015-04-12 MED ORDER — CARBAMIDE PEROXIDE 6.5 % OT SOLN
OTIC | Status: AC
  Administered 2015-04-12: 5 [drp] via OTIC
  Filled 2015-04-12: qty 15

## 2015-04-12 MED ORDER — CARBAMIDE PEROXIDE 6.5 % OT SOLN
5.0000 [drp] | Freq: Once | OTIC | Status: AC
Start: 2015-04-12 — End: 2015-04-12
  Administered 2015-04-12: 5 [drp] via OTIC

## 2015-04-12 MED ORDER — CARBAMIDE PEROXIDE 6.5 % OT SOLN
OTIC | Status: AC
  Filled 2015-04-12: qty 15

## 2015-04-12 MED ORDER — CARBAMIDE PEROXIDE 6.5 % OT SOLN: 5.0000 [drp] | Freq: Once | OTIC | Status: DC

## 2015-04-12 MED ORDER — LIDOCAINE-EPINEPHRINE (PF) 1 %-1:200000 IJ SOLN
INTRAMUSCULAR | Status: AC
  Administered 2015-04-12: 30 mL via INTRADERMAL
  Filled 2015-04-12: qty 30

## 2015-04-12 MED ORDER — LIDOCAINE-EPINEPHRINE 1 %-1:100000 IJ SOLN
10.0000 mL | Freq: Once | INTRAMUSCULAR | Status: DC
  Filled 2015-04-12: qty 10
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
## 2015-04-12 NOTE — ED Notes (Signed)
NAD noted at time of D/C. Pt denies questions or concerns. Pt ambulatory to the lobby at this time.  

## 2015-04-12 NOTE — Discharge Instructions (Signed)
Cerumen Impaction °A cerumen impaction is when the wax in your ear forms a plug. This plug usually causes reduced hearing. Sometimes it also causes an earache or dizziness. Removing a cerumen impaction can be difficult and painful. The wax sticks to the ear canal. The canal is sensitive and bleeds easily. If you try to remove a heavy wax buildup with a cotton tipped swab, you may push it in further. °Irrigation with water, suction, and small ear curettes may be used to clear out the wax. If the impaction is fixed to the skin in the ear canal, ear drops may be needed for a few days to loosen the wax. People who build up a lot of wax frequently can use ear wax removal products available in your local drugstore. °SEEK MEDICAL CARE IF:  °You develop an earache, increased hearing loss, or marked dizziness. °Document Released: 11/28/2004 Document Revised: 01/13/2012 Document Reviewed: 01/18/2010 °ExitCare® Patient Information ©2015 [employer]. This information is not intended to replace advice given to you by your health care provider. Make sure you discuss any questions you have with your health care provider. ° °

## 2015-04-12 NOTE — ED Notes (Signed)
R ear irrigated. Pt tolerated procedure well. NAD noted. Pt states improvement to hearing.
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
## 2015-10-09 ENCOUNTER — Emergency Department
Admission: EM | Admit: 2015-10-09 | Discharge: 2015-10-10 | Disposition: A | Discharge status: Home / Self Care | Payer: Self-pay | Attending: Emergency Medicine | Admitting: Emergency Medicine

## 2015-10-09 ENCOUNTER — Emergency Department: Payer: Self-pay

## 2015-10-09 ENCOUNTER — Encounter: Payer: Self-pay | Admitting: Emergency Medicine

## 2015-10-09 DIAGNOSIS — Y990 Civilian activity done for income or pay: Secondary | ICD-10-CM | POA: Insufficient documentation

## 2015-10-09 DIAGNOSIS — S80211A Abrasion, right knee, initial encounter: Secondary | ICD-10-CM | POA: Insufficient documentation

## 2015-10-09 DIAGNOSIS — F17210 Nicotine dependence, cigarettes, uncomplicated: Secondary | ICD-10-CM | POA: Insufficient documentation

## 2015-10-09 DIAGNOSIS — Y9389 Activity, other specified: Secondary | ICD-10-CM | POA: Insufficient documentation

## 2015-10-09 DIAGNOSIS — T148XXA Other injury of unspecified body region, initial encounter: Secondary | ICD-10-CM

## 2015-10-09 DIAGNOSIS — Y9289 Other specified places as the place of occurrence of the external cause: Secondary | ICD-10-CM | POA: Insufficient documentation

## 2015-10-09 DIAGNOSIS — W01198A Fall on same level from slipping, tripping and stumbling with subsequent striking against other object, initial encounter: Secondary | ICD-10-CM | POA: Insufficient documentation

## 2015-10-09 DIAGNOSIS — S8001XA Contusion of right knee, initial encounter: Secondary | ICD-10-CM | POA: Insufficient documentation

## 2015-10-09 NOTE — ED Notes (Signed)
Patient took two tramadol and tried to sleep on it. But states it hurts too bad. 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

## 2015-10-09 NOTE — ED Notes (Signed)
Patient ambulatory to triage with steady gait, without difficulty or distress noted; pt reports fell yesterday injuring right knee; c/o pain since

## 2015-10-10 MED ORDER — IBUPROFEN 600 MG PO TABS: 600.0000 mg | ORAL_TABLET | Freq: Four times a day (QID) | ORAL | Status: DC | PRN

## 2015-10-10 NOTE — ED Provider Notes (Signed)
[facility] Emergency Department Provider Note ____________________________________________  Time seen: Approximately 12:08 AM  I have reviewed the triage vital signs and the nursing notes.   HISTORY  Chief Complaint Knee Pain   HPI Jeremiah Alvarez is a 24 y.o. male who presents to the emergency department for evaluation of right knee pain. He states that he was working on a bobcat and slipped. His knee hit a metal foot rest. He now has abrasions to the knee and pain with ambulation. He has taken 2 tramadol prior to arrival.   History reviewed. No pertinent past medical history.  There are no active problems to display for this patient.   Past Surgical History  Procedure Laterality Date  . Orif finger fracture  09/23/2011    Procedure: OPEN REDUCTION INTERNAL FIXATION (ORIF) METACARPAL (FINGER) FRACTURE;  Surgeon: Marlowe ShoresMatthew A Weingold, MD;  Location: [facility] OR;  Service: Orthopedics;  Laterality: Left;  orif left ring and small metacarpal fractures  . Hardware removal  02/21/2012    Procedure: HARDWARE REMOVAL;  Surgeon: Marlowe ShoresMatthew A Weingold, MD;  Location: [facility];  Service: Orthopedics;  Laterality: Left;    Current Outpatient Rx  Name  Route  Sig  Dispense  Refill  . HYDROcodone-acetaminophen (NORCO/VICODIN) 5-325 MG per tablet      Take 1-2 tablets by mouth every 6 hours as needed for pain.   15 tablet   0   . ibuprofen (ADVIL,MOTRIN) 600 MG tablet   Oral   Take 1 tablet (600 mg total) by mouth every 6 (six) hours as needed.   30 tablet   0     Allergies Review of patient's allergies indicates no known allergies.  No family history on file.  Social History Social History  Substance Use Topics  . Smoking status: Current Some Day Smoker -- 0.50 packs/day for 10 years    Types: Cigarettes  . Smokeless tobacco: Current User  . Alcohol Use: Yes     Comment: OCCAIS    Review of Systems Constitutional: No recent  illness. Eyes: No visual changes. ENT: No sore throat. Cardiovascular: Denies chest pain or palpitations. Respiratory: Denies shortness of breath. Gastrointestinal: No abdominal pain.  Genitourinary: Negative for dysuria. Musculoskeletal: Pain in right knee Skin: Negative for rash. Neurological: Negative for headaches, focal weakness or numbness. 10-point ROS otherwise negative.  ____________________________________________   PHYSICAL EXAM:  VITAL SIGNS: ED Triage Vitals  Enc Vitals Group     BP 10/09/15 2309 115/64 mmHg     Pulse Rate 10/09/15 2309 68     Resp --      Temp 10/09/15 2309 98.8 F (37.1 C)     Temp Source 10/09/15 2309 Oral     SpO2 10/09/15 2309 97 %     Weight 10/09/15 2309 157 lb (71.215 kg)     Height 10/09/15 2309 5\' 7"  (1.702 m)     Head Cir --      Peak Flow --      Pain Score 10/09/15 2309 8     Pain Loc --      Pain Edu? --      Excl. in GC? --     Constitutional: Alert and oriented. Well appearing and in no acute distress. Eyes: Conjunctivae are normal. EOMI. Head: Atraumatic. Nose: No congestion/rhinnorhea. Neck: No stridor.  Respiratory: Normal respiratory effort.   Musculoskeletal: Tenderness noted to the right knee over the patella. Active straight leg raise noted. No deformity or edema.  Neurologic:  Normal speech and language. No gross focal neurologic deficits are appreciated. Speech is normal. No gait instability. Skin: 2 small abrasions noted on the patella. Psychiatric: Mood and affect are normal. Speech and behavior are normal.  ____________________________________________   LABS (all labs ordered are listed, but only abnormal results are displayed)  Labs Reviewed - No data to display ____________________________________________  RADIOLOGY  Right knee film negative for acute bony abnormality per radiology. ____________________________________________   PROCEDURES  Procedure(s) performed:  Bandaid applied over  abrasions and Ace bandage applied to the right knee by ER tech. Neurovascularly intact post-application.   ____________________________________________   INITIAL IMPRESSION / ASSESSMENT AND PLAN / ED COURSE  Pertinent labs & imaging results that were available during my care of the patient were reviewed by me and considered in my medical decision making (see chart for details).  Patient was advised to follow-up with orthopedics for symptoms that are not improving over the week. He was advised to return to the emergency department for symptoms change or worsen if he is unable schedule an appointment. ____________________________________________   FINAL CLINICAL IMPRESSION(S) / ED DIAGNOSES  Final diagnoses:  Abrasion  Knee contusion, right, initial encounter       Chinita Pester, FNP 10/10/15 0015  Chinita Pester, FNP 10/10/15 0015  Darci Current, MD 10/10/15 [phone]

## 2015-10-10 NOTE — Discharge Instructions (Signed)

## 2016-01-04 ENCOUNTER — Emergency Department (HOSPITAL_COMMUNITY)
Admission: EM | Admit: 2016-01-04 | Discharge: 2016-01-04 | Disposition: A | Discharge status: Home / Self Care | Payer: Self-pay | Attending: Emergency Medicine | Admitting: Emergency Medicine

## 2016-01-04 ENCOUNTER — Emergency Department (HOSPITAL_COMMUNITY): Payer: Self-pay

## 2016-01-04 ENCOUNTER — Encounter (HOSPITAL_COMMUNITY): Payer: Self-pay | Admitting: Emergency Medicine

## 2016-01-04 DIAGNOSIS — M7651 Patellar tendinitis, right knee: Secondary | ICD-10-CM | POA: Insufficient documentation

## 2016-01-04 DIAGNOSIS — Z8781 Personal history of (healed) traumatic fracture: Secondary | ICD-10-CM | POA: Insufficient documentation

## 2016-01-04 DIAGNOSIS — F17210 Nicotine dependence, cigarettes, uncomplicated: Secondary | ICD-10-CM | POA: Insufficient documentation

## 2016-01-04 MED ORDER — NAPROXEN 500 MG PO TABS: 500.0000 mg | ORAL_TABLET | Freq: Two times a day (BID) | ORAL | Status: DC

## 2016-01-04 NOTE — Discharge Instructions (Signed)
Avoid strenuous activity or kneeling down on your knees. Ice and elevate. Naprosyn for pain and inflammation. I suspect you have an injury to your patella tendon. Make sure to follow up with orthopedics specialist for further evaluation.    Patellar Tendinitis With Rehab Tendinitis is inflammation of a tendon. Tendonitis of the tendon below the kneecap (patella) is known as patellar tendonitis. Patellar tendonitis is also called jumper's knee. Jumper's knee is a common cause of pain below the kneecap (infrapatellar). Jumper's knee may involve a tear (strain) in the ligament. Strains are classified into three categories. Grade 1 strains cause pain, but the tendon is not lengthened. Grade 2 strains include a lengthened ligament, due to the ligament being stretched or partially ruptured. With grade 2 strains there is still function, although function may be decreased. Grade 3 strains involve a complete tear of the tendon or muscle, and function is usually impaired. Patellar tendon strains are usually grade 1 or 2.  SYMPTOMS   Pain, tenderness, swelling, warmth, or redness over the patellar tendon (just below the kneecap).  Pain and loss of strength (sometimes), with forcefully straightening the knee (especially when jumping or rising from a seated or squatting position), or bending the knee completely (squatting or kneeling).  Crackling sound (crepitation) when the tendon is moved or touched. CAUSES  Patellar tendonitis is caused by injury to the patellar tendon. The inflammation is the body's healing response. Common causes of injury include:  Stress from a sudden increase in intensity, frequency, or duration of training.  Overuse of the thigh muscles (quadriceps) and patellar tendon.  Direct hit (trauma) to the knee or patellar tendon. RISK INCREASES WITH:  Sports that require sudden, explosive quadriceps contraction, such as jumping, quick starts, or kicking.  Running sports, especially  running down hills.  Poor strength and flexibility of the thigh and knee.  Flat feet. PREVENTION  Warm up and stretch properly before activity.  Allow for adequate recovery between workouts.  Maintain physical fitness:  Strength, flexibility, and endurance.  Cardiovascular fitness.  Protect the knee joint with taping, protective strapping, bracing, or elastic compression bandage.  Wear arch supports (orthotics). PROGNOSIS  If treated properly, patellar tendonitis usually heals within 6 weeks.  RELATED COMPLICATIONS   Longer healing time if not properly treated or if not given enough time to heal.  Recurring symptoms if activity is resumed too soon, with overuse, with a direct blow, or when using poor technique.  If untreated, tendon rupture requiring surgery. TREATMENT Treatment first involves the use of ice and medicine to reduce pain and inflammation. The use of strengthening and stretching exercises may help reduce pain with activity. These exercises may be performed at home or with a therapist. Serious cases of tendonitis may require restraining the knee for 10 to 14 days to prevent stress on the tendon and to promote healing. Crutches may be used (uncommon) until you can walk without a limp. For cases in which nonsurgical treatment is unsuccessful, surgery may be advised to remove the inflamed tendon lining (sheath). Surgery is rare, and is only advised after at least 6 months of nonsurgical treatment. MEDICATION   If pain medicine is needed, nonsteroidal anti-inflammatory medicines (aspirin and ibuprofen), or other minor pain relievers (acetaminophen), are often advised.  Do not take pain medicine for 7 days before surgery.  Prescription pain relievers may be given if your caregiver thinks they are needed. Use only as directed and only as much as you need. HEAT AND COLD  Cold  treatment (icing) should be applied for 10 to 15 minutes every 2 to 3 hours for inflammation and  pain, and immediately after activity that aggravates your symptoms. Use ice packs or an ice massage.  Heat treatment may be used before performing stretching and strengthening activities prescribed by your caregiver, physical therapist, or athletic trainer. Use a heat pack or a warm water soak. SEEK MEDICAL CARE IF:  Symptoms get worse or do not improve in 2 weeks, despite treatment.  New, unexplained symptoms develop. (Drugs used in treatment may produce side effects.) EXERCISES RANGE OF MOTION (ROM) AND STRETCHING EXERCISES - Patellar Tendinitis (Jumper's Knee) These are some of the initial exercises with which you may start your rehabilitation program, until you see your caregiver again or until your symptoms are resolved. Remember:   Flexible tissue is more tolerant of the stresses placed on it during activities.  Each stretch should be held for 20 to 30 seconds.  A gentle stretching sensation should be felt. STRETCH - Hamstrings, Supine  Lie on your back. Loop a belt or towel over the ball of your right / left foot.  Straighten your right / left knee and slowly pull on the belt to raise your leg. Do not allow the right / left knee to bend. Keep your opposite leg flat on the floor.  Raise the leg until you feel a gentle stretch behind your right / left knee or thigh. Hold this position for __________ seconds. Repeat __________ times. Complete this stretch __________ times per day.  STRETCH - Hamstrings, Doorway  Lie on your back with your right / left leg extended and resting on the wall, and the opposite leg flat on the ground through the door. At first, position your bottom farther away from the wall.  Keep your right / left knee straight. If you feel a stretch behind your knee or thigh, hold this position for __________ seconds.  If you do not feel a stretch, scoot your bottom closer to the door, and hold __________ seconds. Repeat __________ times. Complete this stretch  __________ times per day.  STRETCH - Hamstrings, Standing  Stand or sit and extend your right / left leg, placing your foot on a chair or foot stool.  Keep a slight arch in your low back and your hips straight forward.  Lead with your chest and lean forward at the waist until you feel a gentle stretch in the back of your right / left knee or thigh. (When done correctly, this exercise requires leaning only a small distance.)  Hold this position for __________ seconds. Repeat __________ times. Complete this stretch __________ times per day. STRETCH - Adductors, Lunge  While standing, spread your legs, with your right / left leg behind you.  Lean away from your right / left leg by bending your opposite knee. You may rest your hands on your thigh for balance.  You should feel a stretch in your right / left inner thigh. Hold for __________ seconds. Repeat __________ times. Complete this exercise __________ times per day.  STRENGTHENING EXERCISES - Patellar Tendinitis (Jumper's Knee) These exercises may help you when beginning to rehabilitate your injury. They may resolve your symptoms with or without further involvement from your physician, physical therapist or athletic trainer. While completing these exercises, remember:   Muscles can gain both the endurance and the strength needed for everyday activities through controlled exercises.  Complete these exercises as instructed by your physician, physical therapist or athletic trainer. Increase the resistance  and repetitions only as guided by your caregiver. STRENGTH - Quadriceps, Isometrics  Lie on your back with your right / left leg extended and your opposite knee bent.  Gradually tense the muscles in the front of your right / left thigh. You should see either your kneecap slide up toward your hip or increased dimpling just above the knee. This motion will push the back of the knee down toward the floor, mat, or bed on which you are  lying.  Hold the muscle as tight as you can, without increasing your pain, for __________ seconds.  Relax the muscles slowly and completely in between each repetition. Repeat __________ times. Complete this exercise __________ times per day.  STRENGTH - Quadriceps, Short Arcs  Lie on your back. Place a __________ inch towel roll under your right / left knee, so that the knee bends slightly.  Raise only your lower leg by tightening the muscles in the front of your thigh. Do not allow your thigh to rise.  Hold this position for __________ seconds. Repeat __________ times. Complete this exercise __________ times per day.  OPTIONAL ANKLE WEIGHTS: Begin with ____________________, but DO NOT exceed ____________________. Increase in 1 pound/ 0.5 kilogram increments. STRENGTH - Quadriceps, Straight Leg Raises  Quality counts! Watch for signs that the quadriceps muscle is working, to be sure you are strengthening the correct muscles and not "cheating" by substituting with healthier muscles.  Lay on your back with your right / left leg extended and your opposite knee bent.  Tense the muscles in the front of your right / left thigh. You should see either your kneecap slide up or increased dimpling just above the knee. Your thigh may even shake a bit.  Tighten these muscles even more and raise your leg 4 to 6 inches off the floor. Hold for __________ seconds.  Keeping these muscles tense, lower your leg.  Relax the muscles slowly and completely between each repetition. Repeat __________ times. Complete this exercise __________ times per day.  STRENGTH - Quadriceps, Squats  Stand in a door frame so that your feet and knees are in line with the frame.  Use your hands for balance, not support, on the frame.  Slowly lower your weight, bending at the hips and knees. Keep your lower legs upright so that they are parallel with the door frame. Squat only within the range that does not increase your  knee pain. Never let your hips drop below your knees.  Slowly return upright, pushing with your legs, not pulling with your hands. Repeat __________ times. Complete this exercise __________ times per day.  STRENGTH - Quadriceps, Step-Downs  Stand on the edge of a step stool or stair. Be prepared to use a countertop or wall for balance, if needed.  Keeping your right / left knee directly over the middle of your foot, slowly touch your opposite heel to the floor or lower step. Do not go all the way to the floor if your knee pain increases; just go as far as you can without increased discomfort. Use your right / left leg muscles, not gravity to lower your body weight.  Slowly push your body weight back up to the starting position. Repeat __________ times. Complete this exercise __________ times per day.    This information is not intended to replace advice given to you by your health care provider. Make sure you discuss any questions you have with your health care provider.   Document Released: 10/21/2005 Document Revised: 03/07/2015  Document Reviewed: 02/02/2009 Elsevier Interactive Patient Education [employer].
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
## 2016-01-04 NOTE — ED Notes (Signed)
Patient states he fell at work onto a metal object mounted on the ground and injured his right knee.  Alert and oriented, no other other complaints at this time.

## 2016-04-15 ENCOUNTER — Encounter (HOSPITAL_COMMUNITY): Payer: Self-pay | Admitting: Emergency Medicine

## 2016-04-15 ENCOUNTER — Ambulatory Visit (HOSPITAL_COMMUNITY)
Admission: EM | Admit: 2016-04-15 | Discharge: 2016-04-15 | Disposition: A | Discharge status: Home / Self Care | Payer: No Typology Code available for payment source | Attending: Family Medicine | Admitting: Family Medicine

## 2016-04-15 DIAGNOSIS — T2111XA Burn of first degree of chest wall, initial encounter: Secondary | ICD-10-CM

## 2016-04-15 MED ORDER — SILVER SULFADIAZINE 1 % EX CREA: 1.0000 "application " | TOPICAL_CREAM | Freq: Two times a day (BID) | CUTANEOUS | Status: DC

## 2016-04-15 NOTE — Discharge Instructions (Signed)
Wash as much as possible, apply cream thinly after. Return as needed.
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
## 2016-05-17 ENCOUNTER — Ambulatory Visit (HOSPITAL_COMMUNITY)
Admission: RE | Admit: 2016-05-17 | Discharge: 2016-05-17 | Disposition: A | Discharge status: Home / Self Care | Payer: Self-pay | Attending: Psychiatry | Admitting: Psychiatry

## 2016-05-17 DIAGNOSIS — F1114 Opioid abuse with opioid-induced mood disorder: Secondary | ICD-10-CM | POA: Insufficient documentation

## 2016-05-17 DIAGNOSIS — F12188 Cannabis abuse with other cannabis-induced disorder: Secondary | ICD-10-CM | POA: Insufficient documentation

## 2016-05-17 DIAGNOSIS — F1814 Inhalant abuse with inhalant-induced mood disorder: Secondary | ICD-10-CM | POA: Insufficient documentation

## 2016-05-17 DIAGNOSIS — F1014 Alcohol abuse with alcohol-induced mood disorder: Secondary | ICD-10-CM | POA: Insufficient documentation
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
## 2016-05-18 ENCOUNTER — Emergency Department (HOSPITAL_COMMUNITY)
Admission: EM | Admit: 2016-05-18 | Discharge: 2016-05-18 | Disposition: A | Discharge status: Home / Self Care | Payer: No Typology Code available for payment source | Attending: Emergency Medicine | Admitting: Emergency Medicine

## 2016-05-18 ENCOUNTER — Emergency Department (HOSPITAL_COMMUNITY)
Admission: EM | Admit: 2016-05-18 | Discharge: 2016-05-18 | Discharge status: Absent without leave | Payer: No Typology Code available for payment source

## 2016-05-18 ENCOUNTER — Encounter (HOSPITAL_COMMUNITY): Payer: Self-pay | Admitting: Emergency Medicine

## 2016-05-18 DIAGNOSIS — F4310 Post-traumatic stress disorder, unspecified: Secondary | ICD-10-CM | POA: Insufficient documentation

## 2016-05-18 DIAGNOSIS — F3289 Other specified depressive episodes: Secondary | ICD-10-CM

## 2016-05-18 DIAGNOSIS — F1490 Cocaine use, unspecified, uncomplicated: Secondary | ICD-10-CM | POA: Insufficient documentation

## 2016-05-18 DIAGNOSIS — F1414 Cocaine abuse with cocaine-induced mood disorder: Secondary | ICD-10-CM | POA: Diagnosis present

## 2016-05-18 DIAGNOSIS — E876 Hypokalemia: Secondary | ICD-10-CM | POA: Insufficient documentation

## 2016-05-18 DIAGNOSIS — F1010 Alcohol abuse, uncomplicated: Secondary | ICD-10-CM | POA: Insufficient documentation

## 2016-05-18 DIAGNOSIS — F1290 Cannabis use, unspecified, uncomplicated: Secondary | ICD-10-CM | POA: Insufficient documentation

## 2016-05-18 DIAGNOSIS — F17290 Nicotine dependence, other tobacco product, uncomplicated: Secondary | ICD-10-CM | POA: Insufficient documentation

## 2016-05-18 DIAGNOSIS — F17210 Nicotine dependence, cigarettes, uncomplicated: Secondary | ICD-10-CM | POA: Insufficient documentation

## 2016-05-18 DIAGNOSIS — F1994 Other psychoactive substance use, unspecified with psychoactive substance-induced mood disorder: Secondary | ICD-10-CM | POA: Diagnosis present

## 2016-05-18 DIAGNOSIS — Z791 Long term (current) use of non-steroidal anti-inflammatories (NSAID): Secondary | ICD-10-CM | POA: Insufficient documentation

## 2016-05-18 DIAGNOSIS — F329 Major depressive disorder, single episode, unspecified: Secondary | ICD-10-CM | POA: Insufficient documentation

## 2016-05-18 DIAGNOSIS — F1910 Other psychoactive substance abuse, uncomplicated: Secondary | ICD-10-CM | POA: Insufficient documentation

## 2016-05-18 DIAGNOSIS — F32A Depression, unspecified: Secondary | ICD-10-CM

## 2016-05-18 DIAGNOSIS — F1920 Other psychoactive substance dependence, uncomplicated: Secondary | ICD-10-CM | POA: Diagnosis present

## 2016-05-18 DIAGNOSIS — Z79899 Other long term (current) drug therapy: Secondary | ICD-10-CM | POA: Insufficient documentation

## 2016-05-18 DIAGNOSIS — F19929 Other psychoactive substance use, unspecified with intoxication, unspecified: Secondary | ICD-10-CM | POA: Diagnosis present

## 2016-05-18 LAB — CBC
HCT: 41.5 % (ref 39.0–52.0)
Hemoglobin: 15.2 g/dL (ref 13.0–17.0)
MCH: 32.2 pg (ref 26.0–34.0)
MCHC: 36.6 g/dL — ABNORMAL HIGH (ref 30.0–36.0)
MCV: 87.9 fL (ref 78.0–100.0)
Platelets: 263 10*3/uL (ref 150–400)
RBC: 4.72 MIL/uL (ref 4.22–5.81)
RDW: 13.1 % (ref 11.5–15.5)
WBC: 13.3 10*3/uL — ABNORMAL HIGH (ref 4.0–10.5)

## 2016-05-18 LAB — COMPREHENSIVE METABOLIC PANEL
ALT: 55 U/L (ref 17–63)
ANION GAP: 9 (ref 5–15)
AST: 30 U/L (ref 15–41)
Albumin: 4.8 g/dL (ref 3.5–5.0)
Alkaline Phosphatase: 37 U/L — ABNORMAL LOW (ref 38–126)
BILIRUBIN TOTAL: 1 mg/dL (ref 0.3–1.2)
BUN: 11 mg/dL (ref 6–20)
CO2: 28 mmol/L (ref 22–32)
CREATININE: 0.91 mg/dL (ref 0.61–1.24)
Calcium: 9.5 mg/dL (ref 8.9–10.3)
Chloride: 105 mmol/L (ref 101–111)
GFR calc Af Amer: >60 mL/min (ref >60)
Glucose, Bld: 87 mg/dL (ref 65–99)
Potassium: 3.1 mmol/L — ABNORMAL LOW (ref 3.5–5.1)
Sodium: 142 mmol/L (ref 135–145)
Total Protein: 8 g/dL (ref 6.5–8.1)

## 2016-05-18 LAB — RAPID URINE DRUG SCREEN, HOSP PERFORMED
AMPHETAMINES: NOT DETECTED
BARBITURATES: NOT DETECTED
BENZODIAZEPINES: POSITIVE — AB
Cocaine: POSITIVE — AB
Opiates: NOT DETECTED
TETRAHYDROCANNABINOL: POSITIVE — AB

## 2016-05-18 LAB — ETHANOL: Alcohol, Ethyl (B): <5 mg/dL (ref <5)

## 2016-05-18 MED ORDER — NICOTINE 21 MG/24HR TD PT24
21.0000 mg | MEDICATED_PATCH | Freq: Once | TRANSDERMAL | Status: DC
  Administered 2016-05-18: 21 mg via TRANSDERMAL
  Filled 2016-05-18: qty 1

## 2016-05-18 MED ORDER — IBUPROFEN 800 MG PO TABS
800.0000 mg | ORAL_TABLET | Freq: Once | ORAL | Status: AC
  Administered 2016-05-18: 800 mg via ORAL
  Filled 2016-05-18: qty 1

## 2016-05-18 MED ORDER — GABAPENTIN 100 MG PO CAPS
200.0000 mg | ORAL_CAPSULE | Freq: Two times a day (BID) | ORAL | Status: DC
  Administered 2016-05-18: 200 mg via ORAL
  Filled 2016-05-18: qty 2

## 2016-05-18 MED ORDER — GABAPENTIN 100 MG PO CAPS: 200.0000 mg | ORAL_CAPSULE | Freq: Two times a day (BID) | ORAL | Status: DC

## 2016-05-18 MED ORDER — POTASSIUM CHLORIDE ER 10 MEQ PO TBCR: 10.0000 meq | EXTENDED_RELEASE_TABLET | Freq: Every day | ORAL | Status: DC

## 2016-05-18 MED ORDER — POTASSIUM CHLORIDE CRYS ER 20 MEQ PO TBCR
40.0000 meq | EXTENDED_RELEASE_TABLET | Freq: Every day | ORAL | Status: DC
  Administered 2016-05-18: 40 meq via ORAL
  Filled 2016-05-18: qty 2

## 2016-05-18 NOTE — [facility] Suicide Risk Assessment (Signed)
Suicide Risk Assessment  Discharge Assessment   [facility] Discharge Suicide Risk Assessment   Principal Problem: Cocaine [facility] with [facility](HCC) Discharge Diagnoses:  Patient Active Problem List   Diagnosis Date Noted  . Polysubstance (including opioids) dependence w/o physiol dependence (HCC) [F19.20] 05/18/2016    Priority: High  . Substance or medication-induced depressive disorder with onset during intoxication [facility](HCC) [F19.94] 05/18/2016    Priority: High  . Cocaine abuse with cocaine-induced mood disorder [facility](HCC) [F14.14] 05/18/2016    Priority: High    Total Time spent with patient: 45 minutes   Musculoskeletal: Strength & Muscle Tone: within normal limits Gait & Station: normal Patient leans: N/A  Psychiatric Specialty Exam: Physical Exam  Constitutional: He is oriented to person, place, and time. He appears well-developed and well-nourished.  HENT:  Head: Normocephalic.  Neck: Normal range of motion.  Respiratory: Effort normal.  Musculoskeletal: Normal range of motion.  Neurological: He is alert and oriented to person, place, and time.  Skin: Skin is warm and dry.  Psychiatric: He has a normal mood and affect. His speech is normal and behavior is normal. Judgment and thought content normal. Cognition and memory are normal.    Review of Systems  Constitutional: Negative.   HENT: Negative.   Eyes: Negative.   Respiratory: Negative.   Cardiovascular: Negative.   Gastrointestinal: Negative.   Genitourinary: Negative.   Musculoskeletal: Negative.   Skin: Negative.   Neurological: Negative.   Endo/Heme/Allergies: Negative.   Psychiatric/Behavioral: Positive for substance abuse.    Blood pressure 100/65, pulse 61, temperature 98 F (36.7 C), temperature source Oral, resp. rate 19, height 5\' 7"  (1.702 m), weight 68.947 kg (152 lb), SpO2 99 %.Body mass index is 23.8 kg/(m^2).  General Appearance: Casual  Eye Contact:  Good  Speech:  Normal Rate   Volume:  Normal  Mood:  Euthymic  Affect:  Congruent  Thought Process:  Coherent and Descriptions of Associations: Intact  Orientation:  Full (Time, Place, and Person)  Thought Content:  WDL  Suicidal Thoughts:  No  Homicidal Thoughts:  No  Memory:  Immediate;   Good Recent;   Good Remote;   Good  Judgement:  Fair  Insight:  Fair  Psychomotor Activity:  Normal  Concentration:  Concentration: Good and Attention Span: Good  Recall:  Good  Fund of Knowledge:  Good  Language:  Good  Akathisia:  No  Handed:  Right  AIMS (if indicated):     Assets:  Housing Intimacy Leisure Time Physical Health Resilience Social Support  ADL's:  Intact  Cognition:  WNL  Sleep:      Mental Status Per Nursing Assessment::   On Admission:   substance abuse with depression  Demographic Factors:  Male and Caucasian  Loss Factors: NA  Historical Factors: NA  Risk Reduction Factors:   Responsible for children under 27 years of age, Sense of responsibility to family, Employed, Living with another person, especially a relative and Positive social support  Continued Clinical Symptoms:  Substance abuse  Cognitive Features That Contribute To Risk:  None    Suicide Risk:  Minimal: No identifiable suicidal ideation.  Patients presenting with no risk factors but with morbid ruminations; may be classified as minimal risk based on the severity of the depressive symptoms  Follow-up Information    Follow up with [facility] with [facility] Go in 2 days.   Contact information:   [address] [place] [id number] [phone] [phone]  Plan Of Care/Follow-up recommendations:  Activity:  as tolerated Diet:  heart healthy diet  LORD, JAMISON, NP 05/18/2016, 11:27 AM

## 2016-05-18 NOTE — ED Notes (Signed)
Pt reports he has been "cramping up lately."  He is requesting a prescription for potassium until he can get set-up w/ a PCP.

## 2016-05-18 NOTE — ED Notes (Addendum)
Nicotine patch removed. Verbalized understanding discharge instructions, prescriptions, and follow-up. In no acute distress.  Pt waiting outside for ride.

## 2016-05-18 NOTE — ED Notes (Signed)
Pt placed in scrubs and security called to wand pt. Pt belongings placed at the nurse's station.
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
## 2016-05-18 NOTE — Consult Note (Signed)
[place] [facility] Consult   Reason for Consult:  Substance abuse with depression Referring Physician:  EDP Patient Identification: Jeremiah Alvarez MRN:  [id number] Principal Diagnosis: Cocaine abuse with cocaine-induced mood disorder [facility]) Diagnosis:   Patient Active Problem List   Diagnosis Date Noted  . Polysubstance (including opioids) dependence w/o physiol dependence ([place]) [F19.20] 05/18/2016    Priority: High  . Substance or medication-induced depressive disorder with onset during intoxication [facility]) [F19.94] 05/18/2016    Priority: High  . Cocaine abuse with cocaine-induced mood disorder [facility]) [F14.14] 05/18/2016    Priority: High    Total Time spent with patient: 45 minutes  Subjective:   Jeremiah Alvarez is a 25 y.o. male patient does not warrant admission.  HPI:  On admission:  25 y.o. male who presents to [facility] accompanied by his mother and uncle, who both participated in assessment at Pt's request. Pt says he is seeking treatment for his "PTSD" and substance abuse. Pt states he has been abusing various substances since he was an Pt reports that he is using cocaine both by snorting and intravenously, snorting pain pills, smoking marijuana daily and drinking alcohol to intoxication but not on a daily basis. Pt appeared guarded and defensive when discussing his pattern of use. He says he feels paranoid and doesn't like to feel he is confined. Pt states he is seeking treatment at this time "because I don't want to end up like other people." Pt reports symptoms including crying spells, social withdrawal, loss of interest in usual pleasures, fatigue, irritability, decreased concentration, decreased sleep, decreased appetite and feelings of guilt and hopelessness. He denies current suicidal ideation or a history of suicide attempts. He denies current homicidal ideation. Pt does have a history of assault and states he has assault charges from 2010 that have not been  resolved. He denies history of auditory or visual hallucinations. Pt states that he is paranoid and that just being the assessment room with one door makes him anxious.  Pt lives with his fiancee, who is pregnant, and three children and works as a Curator. 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 Pt acknowledges that he has received inpatient mental health and substance abuse treatment at [facility] and [facility]. When asked what trauma was related to his diagnosis of PTSD Pt could not identify trauma. Pt's mother says she doesn't think Pt was diagnosed with PTSD but has other mental health problems. She reports Pt has been very aggressive, hitting walls and threatening people. Pt has no current outpatient providers.  Pt is casually dressed, alert, oriented x4 with loud aggressive speech and restless motor behavior. Eye contact is good. Pt's mood is angry, irritable, depressed, anxious and guilty; affect is labile. Thought process is coherent and relevant. There is no indication Pt is currently responding to internal stimuli.   Pt demanded to leave the building to prove he wasn't being "locked up." Pt insisted that he didn't want to be locked in a facility and that he wanted to be assured if he signed in he could leave whenever he wanted.This LPC explained to Pt in detail that if he entered any mental health facility he would be behind locked doors and would not be able to leave until he was evaluated by a psychiatrist. While in the parking  lot he became angry with his mother and uncle. Mother and uncle were demanding Pt stay for treatment. Pt was verbally threatening to assault uncle and mother and uncle drove away. Pt spoke to his finacee on the phone, calmed down and said he wanted to go to [facility] for medical clearance.  Today:   Patient is clear and coherent, does not appear to be under the influence of drugs on assessment.  Alert and oriented x 4.  Denies suicidal/homicidal ideations,hallucinations, and withdrawal symptoms.  He reports sporadic use of drugs based on when he is stressed.  Stressed about his 89.27 year old and pregnant wife.  His other children are with their grandmother or mother but he does have visitation.  Jeremiah Alvarez was offered rehab but he declined.  He would like outpatient therapy, like he had at [facility].  Past Psychiatric History: substance abuse, PTSD  Risk to Self: Is patient at risk for suicide?: No Risk to Others:  none Prior Inpatient Therapy:  denies Prior Outpatient Therapy:  [facility]  Past Medical History: History reviewed. No pertinent past medical history.  Past Surgical History  Procedure Laterality Date  . Orif finger fracture  09/23/2011    Procedure: OPEN REDUCTION INTERNAL FIXATION (ORIF) METACARPAL (FINGER) FRACTURE;  Surgeon: Schuyler Amor, MD;  Location: [place];  Service: Orthopedics;  Laterality: Left;  orif left ring and small metacarpal fractures  . Hardware removal  02/21/2012    Procedure: HARDWARE REMOVAL;  Surgeon: Schuyler Amor, MD;  Location: [place];  Service: Orthopedics;  Laterality: Left;   Family History: No family history on file. Family Psychiatric  History: none Social History:  History  Alcohol Use  . Yes    Comment: OCCAIS     History  Drug Use  . Yes  . Special: Marijuana, Cocaine    Social History   Social History  . Marital Status: Single    Spouse Name: N/A  . Number of Children: N/A  . Years of Education: N/A   Social History Main Topics  . Smoking status: Current Some Day Smoker -- 0.50 packs/day for 10 years    Types: Cigarettes  . Smokeless tobacco: Current User  . Alcohol Use: Yes     Comment: OCCAIS  . Drug Use: Yes    Special: Marijuana, Cocaine  . Sexual Activity: Not Asked   Other Topics Concern   . None   Social History Narrative   Additional Social History:    Allergies:  No Known Allergies  Labs:  Results for orders placed or performed during the hospital encounter of 05/18/16 (from the past 48 hour(s))  Rapid urine drug screen (hospital performed)     Status: Abnormal   Collection Time: 05/18/16  2:35 AM  Result Value Ref Range   Opiates NONE DETECTED NONE DETECTED   Cocaine POSITIVE (A) NONE DETECTED   Benzodiazepines POSITIVE (A) NONE DETECTED   Amphetamines NONE DETECTED NONE DETECTED   Tetrahydrocannabinol POSITIVE (A) NONE DETECTED   Barbiturates NONE DETECTED NONE DETECTED    Comment:        DRUG SCREEN FOR MEDICAL PURPOSES ONLY.  IF CONFIRMATION IS NEEDED FOR ANY PURPOSE, NOTIFY LAB WITHIN 5 DAYS.        LOWEST DETECTABLE LIMITS FOR URINE DRUG SCREEN Drug Class       Cutoff (ng/mL) Amphetamine      1000 Barbiturate      200 Benzodiazepine   094 Tricyclics       709 Opiates  300 Cocaine          300 THC              50   Comprehensive metabolic panel     Status: Abnormal   Collection Time: 05/18/16  2:40 AM  Result Value Ref Range   Sodium 142 135 - 145 mmol/L   Potassium 3.1 (L) 3.5 - 5.1 mmol/L   Chloride 105 101 - 111 mmol/L   CO2 28 22 - 32 mmol/L   Glucose, Bld 87 65 - 99 mg/dL   BUN 11 6 - 20 mg/dL   Creatinine, Ser 0.91 0.61 - 1.24 mg/dL   Calcium 9.5 8.9 - 10.3 mg/dL   Total Protein 8.0 6.5 - 8.1 g/dL   Albumin 4.8 3.5 - 5.0 g/dL   AST 30 15 - 41 U/L   ALT 55 17 - 63 U/L   Alkaline Phosphatase 37 (L) 38 - 126 U/L   Total Bilirubin 1.0 0.3 - 1.2 mg/dL   GFR calc non Af Amer >60 >60 mL/min   GFR calc Af Amer >60 >60 mL/min    Comment: (NOTE) The eGFR has been calculated using the CKD EPI equation. This calculation has not been validated in all clinical situations. eGFR's persistently <60 mL/min signify possible Chronic Kidney Disease.    Anion gap 9 5 - 15  Ethanol     Status: None   Collection Time: 05/18/16  2:40 AM   Result Value Ref Range   Alcohol, Ethyl (B) <5 <5 mg/dL    Comment:        LOWEST DETECTABLE LIMIT FOR SERUM ALCOHOL IS 5 mg/dL FOR MEDICAL PURPOSES ONLY   cbc     Status: Abnormal   Collection Time: 05/18/16  2:40 AM  Result Value Ref Range   WBC 13.3 (H) 4.0 - 10.5 K/uL   RBC 4.72 4.22 - 5.81 MIL/uL   Hemoglobin 15.2 13.0 - 17.0 g/dL   HCT 41.5 39.0 - 52.0 %   MCV 87.9 78.0 - 100.0 fL   MCH 32.2 26.0 - 34.0 pg   MCHC 36.6 (H) 30.0 - 36.0 g/dL   RDW 13.1 11.5 - 15.5 %   Platelets 263 150 - 400 K/uL    Current Facility-Administered Medications  Medication Dose Route Frequency Provider Last Rate Last Dose  . gabapentin (NEURONTIN) capsule 200 mg  200 mg Oral BID Aamari West, MD      . nicotine (NICODERM CQ - dosed in mg/24 hours) patch 21 mg  21 mg Transdermal Once [employer], PA-C   21 mg at 05/18/16 0139  . potassium chloride SA (K-DUR,KLOR-CON) CR tablet 40 mEq  40 mEq Oral Daily Hannah Muthersbaugh, PA-C   40 mEq at 05/18/16 1038   Current Outpatient Prescriptions  Medication Sig Dispense Refill  . acetaminophen (TYLENOL) 500 MG tablet Take 500-1,000 mg by mouth every 6 (six) hours as needed (for pain.).    Marland Kitchen ibuprofen (ADVIL,MOTRIN) 200 MG tablet Take 800 mg by mouth every 6 (six) hours as needed (for pain.).    Marland Kitchen naproxen (NAPROSYN) 500 MG tablet Take 1 tablet (500 mg total) by mouth 2 (two) times daily. (Patient not taking: Reported on 05/18/2016) 30 tablet 0  . silver sulfADIAZINE (SILVADENE) 1 % cream Apply 1 application topically 2 (two) times daily. (Patient not taking: Reported on 05/18/2016) 50 g 0    Musculoskeletal: Strength & Muscle Tone: within normal limits Gait & Station: normal Patient leans: N/A  Psychiatric Specialty Exam: Physical  Exam  Constitutional: He is oriented to person, place, and time. He appears well-developed and well-nourished.  HENT:  Head: Normocephalic.  Neck: Normal range of motion.  Respiratory: Effort normal.   Musculoskeletal: Normal range of motion.  Neurological: He is alert and oriented to person, place, and time.  Skin: Skin is warm and dry.  Psychiatric: He has a normal mood and affect. His speech is normal and behavior is normal. Judgment and thought content normal. Cognition and memory are normal.    Review of Systems  Constitutional: Negative.   HENT: Negative.   Eyes: Negative.   Respiratory: Negative.   Cardiovascular: Negative.   Gastrointestinal: Negative.   Genitourinary: Negative.   Musculoskeletal: Negative.   Skin: Negative.   Neurological: Negative.   Endo/Heme/Allergies: Negative.   Psychiatric/Behavioral: Positive for substance abuse.    Blood pressure 100/65, pulse 61, temperature 98 F (36.7 C), temperature source Oral, resp. rate 19, height 5' 7"  (1.702 m), weight 68.947 kg (152 lb), SpO2 99 %.Body mass index is 23.8 kg/(m^2).  General Appearance: Casual  Eye Contact:  Good  Speech:  Normal Rate  Volume:  Normal  Mood:  Euthymic  Affect:  Congruent  Thought Process:  Coherent and Descriptions of Associations: Intact  Orientation:  Full (Time, Place, and Person)  Thought Content:  WDL  Suicidal Thoughts:  No  Homicidal Thoughts:  No  Memory:  Immediate;   Good Recent;   Good Remote;   Good  Judgement:  Fair  Insight:  Fair  Psychomotor Activity:  Normal  Concentration:  Concentration: Good and Attention Span: Good  Recall:  Good  Fund of Knowledge:  Good  Language:  Good  Akathisia:  No  Handed:  Right  AIMS (if indicated):     Assets:  Housing Intimacy Leisure Time Physical Health Resilience Social Support  ADL's:  Intact  Cognition:  WNL  Sleep:        Treatment Plan Summary: Daily contact with patient to assess and evaluate symptoms and progress in treatment, Medication management and Plan cocaine abuse with cocaine induced mood disorder:  -Crisis stabilization -Medication management:  Start gabapentin 200 mg BID for substance  abuse -Individual and substance counseling  Disposition: No evidence of imminent risk to self or others at present.    Waylan Boga, NP 05/18/2016 11:17 AM Patient seen face-to-face for psychiatric evaluation, chart reviewed and case discussed with the physician extender and developed treatment plan. Reviewed the information documented and agree with the treatment plan. Corena Pilgrim, MD

## 2016-05-18 NOTE — ED Notes (Addendum)
Pt from home with complaints of PTSD and wants help detoxing from etoh, cocaine, and thc. Pt states he has PTSD from the [employer]. He finished serving in 2010. Pt states he has never seen a therapist for this. Pt states he lives with his fiance and 3 children. Pt denies changes with his support system. Pt states he feels anxious, paranoid, and depressed. Pt denies SI, HI, AVH. Pt denies changes in his sleep habits lately. Pt's last use of etoh, and cocaine was prior to arrival. Pt's last use of thc was yesterday. Pt is calm and cooperative at time of assessment. Pt states he has been feeling hypervigilent recently

## 2016-05-18 NOTE — Progress Notes (Addendum)
Patient returned to the ED wanting admission despite being told prior to discharge he did not meet criteria.  He was encouraged to follow-up with the plethora of resources provided for NA/AA/ meetings that meet today and rehab facilities.  His wife is the one pushing for his admission according to the patient.  The patient stated "Oh, so I need to go home and show my ass again to get admitted."  Advised again to use his resources.  Patient and his wife (especially) appeared desperate for him to be admitted, pending court dates assessed and found the following: CAVEAT:  Patient missed his court date yesterday for 3 charges, one was a DWI.  Two charges and a court date on 8/1 for assault on a male and intent to cause serious harm  Nanine MeansJamison Lord, PMH-NP

## 2018-02-01 ENCOUNTER — Emergency Department (HOSPITAL_COMMUNITY)
Admission: EM | Admit: 2018-02-01 | Discharge: 2018-02-02 | Attending: Emergency Medicine | Admitting: Emergency Medicine

## 2018-02-01 ENCOUNTER — Encounter (HOSPITAL_COMMUNITY): Payer: Self-pay | Admitting: Emergency Medicine

## 2018-02-01 DIAGNOSIS — R45851 Suicidal ideations: Secondary | ICD-10-CM

## 2018-02-01 DIAGNOSIS — F4310 Post-traumatic stress disorder, unspecified: Secondary | ICD-10-CM | POA: Insufficient documentation

## 2018-02-01 DIAGNOSIS — F332 Major depressive disorder, recurrent severe without psychotic features: Secondary | ICD-10-CM | POA: Diagnosis not present

## 2018-02-01 DIAGNOSIS — Z046 Encounter for general psychiatric examination, requested by authority: Secondary | ICD-10-CM | POA: Diagnosis not present

## 2018-02-01 DIAGNOSIS — X781XXA Intentional self-harm by knife, initial encounter: Secondary | ICD-10-CM | POA: Diagnosis not present

## 2018-02-01 DIAGNOSIS — Z23 Encounter for immunization: Secondary | ICD-10-CM | POA: Diagnosis not present

## 2018-02-01 DIAGNOSIS — Y999 Unspecified external cause status: Secondary | ICD-10-CM | POA: Insufficient documentation

## 2018-02-01 DIAGNOSIS — Y9389 Activity, other specified: Secondary | ICD-10-CM | POA: Diagnosis not present

## 2018-02-01 DIAGNOSIS — S61511A Laceration without foreign body of right wrist, initial encounter: Secondary | ICD-10-CM | POA: Diagnosis not present

## 2018-02-01 DIAGNOSIS — F17210 Nicotine dependence, cigarettes, uncomplicated: Secondary | ICD-10-CM | POA: Diagnosis not present

## 2018-02-01 DIAGNOSIS — Y92149 Unspecified place in prison as the place of occurrence of the external cause: Secondary | ICD-10-CM | POA: Insufficient documentation

## 2018-02-01 DIAGNOSIS — S6982XA Other specified injuries of left wrist, hand and finger(s), initial encounter: Secondary | ICD-10-CM | POA: Diagnosis present

## 2018-02-01 DIAGNOSIS — S61512A Laceration without foreign body of left wrist, initial encounter: Secondary | ICD-10-CM

## 2018-02-01 MED ORDER — BACITRACIN ZINC 500 UNIT/GM EX OINT
TOPICAL_OINTMENT | Freq: Every day | CUTANEOUS | Status: DC
  Administered 2018-02-01: 1 via TOPICAL
  Filled 2018-02-01: qty 0.9

## 2018-02-01 MED ORDER — TETANUS-DIPHTH-ACELL PERTUSSIS 5-2.5-18.5 LF-MCG/0.5 IM SUSP
0.5000 mL | Freq: Once | INTRAMUSCULAR | Status: AC
  Administered 2018-02-01: 0.5 mL via INTRAMUSCULAR
  Filled 2018-02-01: qty 0.5

## 2018-02-01 NOTE — ED Provider Notes (Signed)
[facility]-EMERGENCY DEPT Provider Note   CSN: [id number] Arrival date & time: 02/01/18  2224     History   Chief Complaint Chief Complaint  Patient presents with  . Laceration    bilateral wrist lacs     HPI Jeremiah Alvarez is a 27 y.o. male.  Patient presents in custody with a chief complaint of bilateral self-inflicted wrist lacerations.  He states that he cut himself using a razor blade because "it was too much."  He further states that he did this to cope, but would've been fine if it killed him.  He denies any other complaints.  Bleeding controlled with gauze.  Hx of polysbustance abuse.  The history is provided by the patient. No language interpreter was used.    History reviewed. No pertinent past medical history.  Patient Active Problem List   Diagnosis Date Noted  . Polysubstance (including opioids) dependence w/o physiol dependence ([facility]) 05/18/2016  . Substance or medication-induced depressive disorder with onset during intoxication ([facility]) 05/18/2016  . Cocaine abuse with cocaine-induced mood disorder ([facility]) 05/18/2016    Past Surgical History:  Procedure Laterality Date  . HARDWARE REMOVAL  02/21/2012   Procedure: HARDWARE REMOVAL;  Surgeon: Marlowe ShoresMatthew A Weingold, MD;  Location: [facility];  Service: Orthopedics;  Laterality: Left;  . ORIF FINGER FRACTURE  09/23/2011   Procedure: OPEN REDUCTION INTERNAL FIXATION (ORIF) METACARPAL (FINGER) FRACTURE;  Surgeon: Marlowe ShoresMatthew A Weingold, MD;  Location: [facility];  Service: Orthopedics;  Laterality: Left;  orif left ring and small metacarpal fractures        Home Medications    Prior to Admission medications   Medication Sig Start Date End Date Taking? Authorizing Provider  acetaminophen (TYLENOL) 500 MG tablet Take 500-1,000 mg by mouth every 6 (six) hours as needed (for pain.).    [provider]  gabapentin (NEURONTIN) 100 MG capsule Take 2 capsules (200 mg total) by mouth 2  (two) times daily. 05/18/16   Charm RingsLord, Jamison Y, NP  ibuprofen (ADVIL,MOTRIN) 200 MG tablet Take 800 mg by mouth every 6 (six) hours as needed (for pain.).    [provider]  naproxen (NAPROSYN) 500 MG tablet Take 1 tablet (500 mg total) by mouth 2 (two) times daily. Patient not taking: Reported on 05/18/2016 01/04/16   Jaynie CrumbleKirichenko, Tatyana, PA-C  potassium chloride (K-DUR) 10 MEQ tablet Take 1 tablet (10 mEq total) by mouth daily. 05/18/16   Eyvonne MechanicHedges, Jeffrey, PA-C    Family History No family history on file.  Social History Social History   Tobacco Use  . Smoking status: Current Some Day Smoker    Packs/day: 0.50    Years: 10.00    Pack years: 5.00    Types: Cigarettes  . Smokeless tobacco: Current User  Substance Use Topics  . Alcohol use: Yes    Comment: OCCAIS  . Drug use: Yes    Types: Marijuana, Cocaine     Allergies   Patient has no known allergies.   Review of Systems Review of Systems  All other systems reviewed and are negative.    Physical Exam Updated Vital Signs BP 102/87 (BP Location: Right Arm)   Pulse 77   Temp 98.7 F (37.1 C) (Oral)   Resp 16   SpO2 99%   Physical Exam  Constitutional: He is oriented to person, place, and time. No distress.  HENT:  Head: Normocephalic and atraumatic.  Eyes: Pupils are equal, round, and reactive to light. Conjunctivae and EOM are  normal.  Neck: No tracheal deviation present.  Cardiovascular: Normal rate.  Pulmonary/Chest: Effort normal. No respiratory distress.  Abdominal: Soft.  Musculoskeletal: Normal range of motion.  Neurological: He is alert and oriented to person, place, and time.  Skin: Skin is warm and dry. He is not diaphoretic.  5 cm shallow laceration across left anterior wrist (4) 3 cm very shallow lacerations across right anterior wrist No FB, no tendon or deep structures involved  Numerous healed scars from prior cutting  Psychiatric: Judgment normal.  Nursing note and vitals  reviewed.    ED Treatments / Results  Labs (all labs ordered are listed, but only abnormal results are displayed) Labs Reviewed  CBC WITH DIFFERENTIAL/PLATELET  BASIC METABOLIC PANEL  ETHANOL  RAPID URINE DRUG SCREEN, HOSP PERFORMED  ACETAMINOPHEN LEVEL  SALICYLATE LEVEL    EKG None  Radiology No results found.  Procedures Procedures (including critical care time) LACERATION REPAIR Performed by: Roxy Horseman Authorized by: Roxy Horseman Consent: Verbal consent obtained. Risks and benefits: risks, benefits and alternatives were discussed Consent given by: patient Patient identity confirmed: provided demographic data Prepped and Draped in normal sterile fashion Wound explored  Laceration Location: left wrist  Laceration Length: 5cm  No Foreign Bodies seen or palpated  Anesthesia: none  Local anesthetic: none  Anesthetic total: none  Irrigation method: syringe Amount of cleaning: standard  Skin closure: dermabond  Number of sutures: dermabond  Technique: dermabond  Patient tolerance: Patient tolerated the procedure well with no immediate complications.  Medications Ordered in ED Medications  Tdap (BOOSTRIX) injection 0.5 mL (has no administration in time range)     Initial Impression / Assessment and Plan / ED Course  I have reviewed the triage vital signs and the nursing notes.  Pertinent labs & imaging results that were available during my care of the patient were reviewed by me and considered in my medical decision making (see chart for details).     Patient with bilateral self-inflicted wrist lacerations.  The left requires repair, but is linear and dermabond should do fine.    TTS consult due to patient stating that he did this to cope as well as potentially kill himself.  TTS has evaluated the patient and informs me that the jail can put patient on suicide watch and can have their psychiatrist work toward outpatient placement after his  release. Final Clinical Impressions(s) / ED Diagnoses   Final diagnoses:  Suicidal ideation  Laceration of right wrist, initial encounter  Laceration of left wrist, initial encounter    ED Discharge Orders    None       Roxy Horseman, PA-C 02/02/18 0051    Zadie Rhine, MD 02/02/18 [phone]

## 2018-02-02 LAB — CBC WITH DIFFERENTIAL/PLATELET
BASOS ABS: 0 10*3/uL (ref 0.0–0.1)
BASOS PCT: 0 %
EOS ABS: 0.2 10*3/uL (ref 0.0–0.7)
EOS PCT: 2 %
HCT: 39.2 % (ref 39.0–52.0)
HEMOGLOBIN: 13.7 g/dL (ref 13.0–17.0)
LYMPHS ABS: 3.1 10*3/uL (ref 0.7–4.0)
Lymphocytes Relative: 30 %
MCH: 31.3 pg (ref 26.0–34.0)
MCHC: 34.9 g/dL (ref 30.0–36.0)
MCV: 89.5 fL (ref 78.0–100.0)
Monocytes Absolute: 0.8 10*3/uL (ref 0.1–1.0)
Monocytes Relative: 8 %
NEUTROS PCT: 60 %
Neutro Abs: 6.1 10*3/uL (ref 1.7–7.7)
Platelets: 240 10*3/uL (ref 150–400)
RBC: 4.38 MIL/uL (ref 4.22–5.81)
RDW: 12.9 % (ref 11.5–15.5)
WBC: 10.1 10*3/uL (ref 4.0–10.5)

## 2018-02-02 LAB — BASIC METABOLIC PANEL
ANION GAP: 8 (ref 5–15)
BUN: 9 mg/dL (ref 6–20)
CO2: 27 mmol/L (ref 22–32)
Calcium: 9.2 mg/dL (ref 8.9–10.3)
Chloride: 106 mmol/L (ref 101–111)
Creatinine, Ser: 0.73 mg/dL (ref 0.61–1.24)
GFR calc non Af Amer: >60 mL/min (ref >60)
Glucose, Bld: 82 mg/dL (ref 65–99)
POTASSIUM: 3.6 mmol/L (ref 3.5–5.1)
SODIUM: 141 mmol/L (ref 135–145)

## 2018-02-02 LAB — RAPID URINE DRUG SCREEN, HOSP PERFORMED
AMPHETAMINES: NOT DETECTED
BENZODIAZEPINES: NOT DETECTED
Barbiturates: NOT DETECTED
COCAINE: NOT DETECTED
OPIATES: NOT DETECTED
Tetrahydrocannabinol: NOT DETECTED

## 2018-02-02 LAB — ETHANOL

## 2018-02-02 LAB — SALICYLATE LEVEL: Salicylate Lvl: <7 mg/dL (ref 2.8–30.0)

## 2018-02-02 LAB — ACETAMINOPHEN LEVEL
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
## 2018-02-16 ENCOUNTER — Encounter (HOSPITAL_COMMUNITY): Payer: Self-pay | Admitting: Emergency Medicine

## 2018-02-16 ENCOUNTER — Other Ambulatory Visit: Payer: Self-pay

## 2018-02-16 ENCOUNTER — Emergency Department (HOSPITAL_COMMUNITY)
Admission: EM | Admit: 2018-02-16 | Discharge: 2018-02-16 | Disposition: A | Discharge status: Home / Self Care | Attending: Emergency Medicine | Admitting: Emergency Medicine

## 2018-02-16 DIAGNOSIS — T401X1A Poisoning by heroin, accidental (unintentional), initial encounter: Secondary | ICD-10-CM

## 2018-02-16 DIAGNOSIS — F17210 Nicotine dependence, cigarettes, uncomplicated: Secondary | ICD-10-CM | POA: Insufficient documentation

## 2018-02-16 LAB — CBC WITH DIFFERENTIAL/PLATELET
Basophils Absolute: 0 10*3/uL (ref 0.0–0.1)
Basophils Relative: 0 %
EOS PCT: 1 %
Eosinophils Absolute: 0.1 10*3/uL (ref 0.0–0.7)
HEMATOCRIT: 41.2 % (ref 39.0–52.0)
Hemoglobin: 14.7 g/dL (ref 13.0–17.0)
LYMPHS ABS: 2.5 10*3/uL (ref 0.7–4.0)
LYMPHS PCT: 14 %
MCH: 32 pg (ref 26.0–34.0)
MCHC: 35.7 g/dL (ref 30.0–36.0)
MCV: 89.8 fL (ref 78.0–100.0)
MONO ABS: 0.6 10*3/uL (ref 0.1–1.0)
Monocytes Relative: 3 %
NEUTROS ABS: 14.7 10*3/uL — AB (ref 1.7–7.7)
Neutrophils Relative %: 82 %
PLATELETS: 205 10*3/uL (ref 150–400)
RBC: 4.59 MIL/uL (ref 4.22–5.81)
RDW: 12.7 % (ref 11.5–15.5)
WBC: 17.9 10*3/uL — ABNORMAL HIGH (ref 4.0–10.5)

## 2018-02-16 LAB — COMPREHENSIVE METABOLIC PANEL
ALBUMIN: 4.3 g/dL (ref 3.5–5.0)
ALT: 65 U/L — ABNORMAL HIGH (ref 17–63)
ANION GAP: 13 (ref 5–15)
AST: 67 U/L — AB (ref 15–41)
Alkaline Phosphatase: 40 U/L (ref 38–126)
BILIRUBIN TOTAL: 1 mg/dL (ref 0.3–1.2)
BUN: 15 mg/dL (ref 6–20)
CO2: 26 mmol/L (ref 22–32)
CREATININE: 1.24 mg/dL (ref 0.61–1.24)
Calcium: 8.6 mg/dL — ABNORMAL LOW (ref 8.9–10.3)
Chloride: 101 mmol/L (ref 101–111)
GFR calc Af Amer: >60 mL/min (ref >60)
GFR calc non Af Amer: >60 mL/min (ref >60)
GLUCOSE: 238 mg/dL — AB (ref 65–99)
Potassium: 3.3 mmol/L — ABNORMAL LOW (ref 3.5–5.1)
Sodium: 140 mmol/L (ref 135–145)
TOTAL PROTEIN: 7.4 g/dL (ref 6.5–8.1)

## 2018-02-16 LAB — CBG MONITORING, ED: Glucose-Capillary: 261 mg/dL — ABNORMAL HIGH (ref 65–99)

## 2018-02-16 LAB — ACETAMINOPHEN LEVEL: Acetaminophen (Tylenol), Serum: <10 ug/mL — ABNORMAL LOW (ref 10–30)

## 2018-02-16 LAB — SALICYLATE LEVEL: Salicylate Lvl: <7 mg/dL (ref 2.8–30.0)

## 2018-02-16 LAB — ETHANOL: Alcohol, Ethyl (B): <10 mg/dL (ref <10)

## 2018-02-16 MED ORDER — HALOPERIDOL LACTATE 5 MG/ML IJ SOLN
5.0000 mg | Freq: Once | INTRAMUSCULAR | Status: AC
  Administered 2018-02-16: 5 mg via INTRAMUSCULAR

## 2018-02-16 MED ORDER — SODIUM CHLORIDE 0.9 % IV BOLUS
1000.0000 mL | Freq: Once | INTRAVENOUS | Status: AC
  Administered 2018-02-16: 1000 mL via INTRAVENOUS

## 2018-02-16 MED ORDER — SODIUM CHLORIDE 0.9 % IV SOLN: INTRAVENOUS | Status: DC

## 2018-02-16 NOTE — ED Triage Notes (Signed)
Pt brought in by friends after being found with "a pulse but not breathing"  Narcan given intranasally by triage RN.  Pt alert, altered, combative,  Vomiting.  Pt speaking. Airway intact on arrival to room.

## 2018-02-16 NOTE — Discharge Instructions (Signed)
To find a primary care or specialty doctor please call [phone] or [phone] to access "[facility] Find a Doctor Service." ° °You may also go on the [facility] website at [email or web address] ° °There are also multiple [facility], [facility] and [facility] practices throughout the [place] that are frequently accepting new patients. You may find a clinic that is close to your home and contact them. ° °[facility] -  °[address] °[place] [address] °[phone] ° ° °[facility] -  °[address] °[place] [address] °[phone] ° ° °[facility] - °[address]  °[place] [address] °[phone] ° ° °

## 2018-02-16 NOTE — ED Provider Notes (Signed)
TIME SEEN: 2:00 AM  CHIEF COMPLAINT: Overdose  HPI: Patient is a 27 year old male with history of psychiatric illness, substance abuse who presents to the emergency department as a potential overdose.  Brought in by friend who found him diaphoretic, unresponsive and with agonal respirations.  Here patient is apneic.  Still had a pulse.  Given 2 mg of intranasal Narcan in the waiting room by police.  Patient now more awake and intermittently combative and then will fall asleep.  Unable to answer questions or follow commands.  ROS: Level 5 caveat secondary to altered mental status  PAST MEDICAL HISTORY/PAST SURGICAL HISTORY:  History reviewed. No pertinent past medical history.  MEDICATIONS:  Prior to Admission medications   Medication Sig Start Date End Date Taking? Authorizing Provider  acetaminophen (TYLENOL) 500 MG tablet Take 500-1,000 mg by mouth every 6 (six) hours as needed (for pain.).    [provider]  ibuprofen (ADVIL,MOTRIN) 200 MG tablet Take 800 mg by mouth every 6 (six) hours as needed (for pain.).    [provider]  naproxen (NAPROSYN) 500 MG tablet Take 1 tablet (500 mg total) by mouth 2 (two) times daily. Patient not taking: Reported on 05/18/2016 01/04/16   Jaynie Crumble, PA-C    ALLERGIES:  No Known Allergies  SOCIAL HISTORY:  Social History   Tobacco Use  . Smoking status: Current Some Day Smoker    Packs/day: 0.50    Years: 10.00    Pack years: 5.00    Types: Cigarettes  . Smokeless tobacco: Current User  Substance Use Topics  . Alcohol use: Yes    Comment: OCCAIS    FAMILY HISTORY: No family history on file.  EXAM: BP 100/76   Pulse 71   Resp 13   SpO2 97% Temp 97.4 CONSTITUTIONAL: Alert and oriented and responds appropriately to questions.  Chronically ill-appearing, in no distress, patient will wake up intermittently and answer questions and move all extremities but then falls back asleep HEAD: Normocephalic,  atraumatic EYES: Conjunctivae clear, pupils appear equal, EOMI ENT: normal nose; moist mucous membranes NECK: Supple, no meningismus, no nuchal rigidity, no LAD  CARD: RRR; S1 and S2 appreciated; no murmurs, no clicks, no rubs, no gallops RESP: Normal chest excursion without splinting or tachypnea; breath sounds clear and equal bilaterally; no wheezes, no rhonchi, no rales, no hypoxia or respiratory distress, speaking full sentences ABD/GI: Normal bowel sounds; non-distended; soft, non-tender, no rebound, no guarding, no peritoneal signs, no hepatosplenomegaly BACK:  The back appears normal and is non-tender to palpation, there is no CVA tenderness EXT: Normal ROM in all joints; non-tender to palpation; no edema; normal capillary refill; no cyanosis, no calf tenderness or swelling    SKIN: Normal color for age and race; warm; no rash NEURO: Moves all extremities equally   MEDICAL DECISION MAKING: Patient here is possible overdose.  Suspect opiate overdose as he responded quickly to Narcan.  He is now unfortunately combative and a risk to staff and himself.  We will give IM Haldol so that we can continue to monitor him and evaluate him in the ED.  Will obtain labs, urine.  Glucose normal.  EKG shows no significant abnormality.  No sign of trauma on exam.  Hemodynamically stable.  Protecting his airway at this time.  ED PROGRESS: Patient now awake and oriented x3.  He does state that he relapsed and used heroin last night.  Has history of substance abuse.  States he plans to go to [facility] today.  Denies that this was an intent to hurt himself.  States it was accidental.  His labs are unremarkable other than leukocytosis which I think is reactive.  He does have slightly soft blood pressure but this is improving with hydration and I do not feel he has sepsis.  Mildly elevated AST and ALT.  Alcohol level is negative.  Tylenol and salicylate levels negative.  Will ambulate patient, offer food and reassess  vital signs.   6:40 AM  Pt has no current complaints.  He has been able to ambulate without difficulty.  Eating and drinking.  I feel he is safe for discharge.  Blood pressure has improved.  Given outpatient follow-up information.  I do not feel he needs emergent psychiatric evaluation.   At this time, I do not feel there is any life-threatening condition present. I have reviewed and discussed all results (EKG, imaging, lab, urine as appropriate) and exam findings with patient/family. I have reviewed nursing notes and appropriate previous records.  I feel the patient is safe to be discharged home without further emergent workup and can continue workup as an outpatient as needed. Discussed usual and customary return precautions. Patient/family verbalize understanding and are comfortable with this plan.  Outpatient follow-up has been provided if needed. All questions have been answered.   EKG Interpretation  Date/Time:  Monday February 16 2018 01:59:38 EDT Ventricular Rate:  88 PR Interval:    QRS Duration: 101 QT Interval:  392 QTC Calculation: 475 R Axis:   110 Text Interpretation:  Sinus rhythm Borderline right axis deviation Borderline prolonged QT interval No significant change since last tracing Confirmed by Rochele RaringWard, Opal Mckellips [phone]([id number]) on 02/16/2018 2:40:31 AM        CRITICAL CARE Performed by: Baxter HireKristen Natika Geyer   Total critical care time: 40 minutes  Critical care time was exclusive of separately billable procedures and treating other patients.  Critical care was necessary to treat or prevent imminent or life-threatening deterioration.  Critical care was time spent personally by me on the following activities: development of treatment plan with patient and/or surrogate as well as nursing, discussions with consultants, evaluation of patient's response to treatment, examination of patient, obtaining history from patient or surrogate, ordering and performing treatments and interventions, ordering  and review of laboratory studies, ordering and review of radiographic studies, pulse oximetry and re-evaluation of patient's condition.    Eleny Cortez, Layla MawKristen N, DO 02/16/18 [phone]

## 2018-02-16 NOTE — ED Notes (Signed)
Attempted PIV x 2; no success at this time.  

## 2018-05-10 ENCOUNTER — Inpatient Hospital Stay
Admit: 2018-05-10 | Discharge: 2018-05-11 | Disposition: A | Discharge status: Home / Self Care | Payer: Self-pay | Attending: Emergency Medicine

## 2018-05-10 DIAGNOSIS — F1910 Other psychoactive substance abuse, uncomplicated: Secondary | ICD-10-CM

## 2018-05-10 LAB — METABOLIC PANEL, COMPREHENSIVE
ALT (SGPT): 42 U/L (ref 12–78)
AST (SGOT): 27 U/L (ref 15–37)
Albumin: 3.6 gm/dl (ref 3.4–5.0)
Alk. phosphatase: 62 U/L (ref 45–117)
Anion gap: 5 mmol/L (ref 5–15)
BUN: 12 mg/dl (ref 7–25)
Bilirubin, total: 0.2 mg/dl (ref 0.2–1.0)
CO2: 29 mEq/L (ref 21–32)
Calcium: 9 mg/dl (ref 8.5–10.1)
Chloride: 106 mEq/L (ref 98–107)
Creatinine: 0.8 mg/dl (ref 0.6–1.3)
GFR est AA: >60
GFR est non-AA: >60
Glucose: 86 mg/dl (ref 74–106)
Potassium: 3.7 mEq/L (ref 3.5–5.1)
Protein, total: 7.3 gm/dl (ref 6.4–8.2)
Sodium: 139 mEq/L (ref 136–145)

## 2018-05-10 LAB — CBC WITH AUTOMATED DIFF
BASOPHILS: 0.5 % (ref 0–3)
EOSINOPHILS: 1.8 % (ref 0–5)
HCT: 42.2 % (ref 37.0–50.0)
HGB: 14.7 gm/dl (ref 12.4–17.2)
IMMATURE GRANULOCYTES: 0.4 % (ref 0.0–3.0)
LYMPHOCYTES: 21.5 % — ABNORMAL LOW (ref 28–48)
MCH: 31.7 pg (ref 23.0–34.6)
MCHC: 34.8 gm/dl (ref 30.0–36.0)
MCV: 91.1 fL (ref 80.0–98.0)
MONOCYTES: 6 % (ref 1–13)
MPV: 9 fL (ref 6.0–10.0)
NEUTROPHILS: 69.8 % — ABNORMAL HIGH (ref 34–64)
NRBC: 0 (ref 0–0)
PLATELET: 336 10*3/uL (ref 140–450)
RBC: 4.63 M/uL (ref 3.80–5.70)
RDW-SD: 43.5 (ref 35.1–43.9)
WBC: 10.2 10*3/uL (ref 4.0–11.0)

## 2018-05-10 LAB — ETHYL ALCOHOL
ALCOHOL(ETHYL),SERUM: <3 mg/dl (ref 0.0–9.0)
Ethyl Alcohol: <3 mg/dl (ref 0.0–9.0)

## 2018-05-10 LAB — COMPREHENSIVE METABOLIC PANEL
ALT: 42 U/L (ref 12–78)
AST: 27 U/L (ref 15–37)
Albumin: 3.6 gm/dl (ref 3.4–5.0)
Alkaline Phosphatase: 62 U/L (ref 45–117)
Anion Gap: 5 mmol/L (ref 5–15)
BUN: 12 mg/dl (ref 7–25)
CO2: 29 mEq/L (ref 21–32)
Calcium: 9 mg/dl (ref 8.5–10.1)
Chloride: 106 mEq/L (ref 98–107)
Creatinine: 0.8 mg/dl (ref 0.6–1.3)
EGFR IF NonAfrican American: >60
GFR African American: >60
Glucose: 86 mg/dl (ref 74–106)
Potassium: 3.7 mEq/L (ref 3.5–5.1)
Sodium: 139 mEq/L (ref 136–145)
Total Bilirubin: 0.2 mg/dl (ref 0.2–1.0)
Total Protein: 7.3 gm/dl (ref 6.4–8.2)

## 2018-05-10 LAB — CBC WITH AUTO DIFFERENTIAL
Basophils %: 0.5 % (ref 0–3)
Eosinophils %: 1.8 % (ref 0–5)
Hematocrit: 42.2 % (ref 37.0–50.0)
Hemoglobin: 14.7 gm/dl (ref 12.4–17.2)
Immature Granulocytes: 0.4 % (ref 0.0–3.0)
Lymphocytes %: 21.5 % — ABNORMAL LOW (ref 28–48)
MCH: 31.7 pg (ref 23.0–34.6)
MCHC: 34.8 gm/dl (ref 30.0–36.0)
MCV: 91.1 fL (ref 80.0–98.0)
MPV: 9 fL (ref 6.0–10.0)
Monocytes %: 6 % (ref 1–13)
Neutrophils %: 69.8 % — ABNORMAL HIGH (ref 34–64)
Nucleated RBCs: 0 (ref 0–0)
Platelets: 336 10*3/uL (ref 140–450)
RBC: 4.63 M/uL (ref 3.80–5.70)
RDW-SD: 43.5 (ref 35.1–43.9)
WBC: 10.2 10*3/uL (ref 4.0–11.0)

## 2018-05-10 MED ORDER — SODIUM CHLORIDE 0.9% BOLUS IV
0.9 % | INTRAVENOUS | Status: AC
Start: 2018-05-10 — End: 2018-05-10
  Administered 2018-05-10: 23:00:00 via INTRAVENOUS

## 2018-05-10 MED ORDER — SODIUM CHLORIDE 0.9 % IJ SYRG
Freq: Once | INTRAMUSCULAR | Status: AC
Start: 2018-05-10 — End: 2018-05-10
  Administered 2018-05-10: 23:00:00 via INTRAVENOUS

## 2018-05-10 MED ORDER — CLONIDINE 0.1 MG TAB
0.1 mg | ORAL | Status: AC
Start: 2018-05-10 — End: 2018-05-10
  Administered 2018-05-10: 23:00:00 via ORAL

## 2018-05-10 MED FILL — CLONIDINE 0.1 MG TAB: 0.1 mg | ORAL | Qty: 1

## 2018-05-10 NOTE — ED Notes (Signed)
Psych consult form faxed and [facility] called. Pt in low bed and on monitors.

## 2018-05-10 NOTE — ED Notes (Signed)
Patient's 3 bags placed in lockers 4/5 in west wing.

## 2018-05-10 NOTE — ED Notes (Addendum)
Dr. Hoyat given report about pt. Doctor talking to pt at this time using computer camera. Pt in low bed.     Dr. Horvat. SCP 05/10/2018.

## 2018-05-10 NOTE — ED Notes (Signed)
Pt seen and discharged by provider. Pt stated to care partner that he feels depressed and is thinking of ending his life. Charge nurse informed.    Pts belongings already locked up. Pt will be placed in a paper gown and reevaluated by provider for need for Psyc consult.

## 2018-05-10 NOTE — ED Notes (Signed)
This nurse received patient from the main side with a complaint of SI to the nurse that had him. Waiting for report from nurse.

## 2018-05-10 NOTE — ED Triage Notes (Signed)
Pt picked up by EMS from [facility] in [place].  Pt visiting from [place]  Pt seeking help from withdrawals - Heroin- Last used yesterday. 1/2 -1 gm dalily for the last 2 years.

## 2018-05-10 NOTE — ED Notes (Signed)
Pt's belongings given back to pt from lockers as well as phone and phone charger.

## 2018-05-10 NOTE — ED Notes (Signed)
Patient's belongings have been inventoried by security and placed in 3 lockers in west wing that are labeled bed 32/15. Patient's cell phone is plugged in in west wing nurses station behind the wheel chair where the wow is plugged in.

## 2018-05-10 NOTE — ED Notes (Signed)
Patient given box lunch and ginger ale.

## 2018-05-10 NOTE — ED Notes (Signed)
Dr. Horvat called back stating pt can be discharged. Dr. Hughes made aware.

## 2018-05-10 NOTE — ED Notes (Signed)
Patient told Leanne that he wanted to harm himself and that he was depressed.

## 2018-05-10 NOTE — ED Notes (Signed)
11:52 PM  05/10/18     Discharge instructions given to patient (name) with verbalization of understanding. Patient accompanied by self.  Patient discharged with the following follow up to [facility] and discharge papers. Patient discharged to home. (destination).      Frederick Steele

## 2018-05-10 NOTE — ED Provider Notes (Signed)
[place]  Emergency Department Treatment Report    Patient: Frederick Steele Age: 27 y.o. Sex: male    Date of Birth: 03-12-1991 Admit Date: 05/10/2018 PCP: None   MRN: [id number]  CSN: [id number]     Room: ER32/ER32 Time Dictated: 6:21 PM        Chief Complaint   Heroin withdrawal  History of Present Illness   27 y.o. male chronic heroin user with last use yesterday.  He reports he is a daily user but has been unable to get heroin today.  He is from [facility] and states that he was left here by "friend".  He is not sure how long he will be in town.  He does report that he wants help to stop heroin.  He injects this typically in his right upper extremity.  Patient also reports methamphetamine use.  Last use was 3 days ago.  He denies other drug use or alcohol.  He states that he is feeling anxious and nervous and sweating.  He denies abdominal pain, nausea, vomiting, muscle aches or joint aches or other signs or symptoms.  He is uncertain how long he will be in town.    Review of Systems   Review of Systems   Constitutional: Positive for diaphoresis. Negative for chills and fever.   HENT: Negative for sore throat.    Respiratory: Negative for cough and shortness of breath.    Cardiovascular: Negative for chest pain and leg swelling.   Gastrointestinal: Negative for abdominal pain, nausea and vomiting.   Genitourinary: Negative for dysuria and flank pain.   Musculoskeletal: Negative for joint pain and myalgias.   Skin: Negative for itching and rash.   Neurological: Negative for dizziness, focal weakness and headaches.   Psychiatric/Behavioral: Negative for depression. The patient is nervous/anxious.        Past Medical/Surgical History   No past medical history on file.  No past surgical history on file.    Social History     Social History     Socioeconomic History   ??? Marital status: SINGLE     Spouse name: Not on file   ??? Number of children: Not on file    ??? Years of education: Not on file   ??? Highest education level: Not on file   Tobacco Use   ??? Smoking status: Current Every Day Smoker   ??? Smokeless tobacco: Never Used   Substance and Sexual Activity   ??? Alcohol use: Yes   ??? Drug use: Yes     Types: Heroin, Methamphetamines       Family History   No family history on file.    Current Medications         Allergies   No Known Allergies    Physical Exam     ED Triage Vitals   ED Encounter Vitals Group      BP 05/10/18 1742 107/80      Pulse (Heart Rate) 05/10/18 1742 99      Resp Rate 05/10/18 1757 18      Temp 05/10/18 1757 98.6 ??F (37 ??C)      Temp src --       O2 Sat (%) 05/10/18 1757 100 %      Weight 05/10/18 1757 145 lb      Height 05/10/18 1757 '5\' 7"'      Physical Exam   Constitutional: He is oriented to person, place, and time and well-developed, well-nourished, and in no  distress. No distress.   HENT:   Head: Normocephalic and atraumatic.   Eyes: Pupils are equal, round, and reactive to light. EOM are normal.   Cardiovascular: Normal rate, regular rhythm and normal heart sounds.   Pulmonary/Chest: Effort normal and breath sounds normal. No respiratory distress.   Abdominal: Soft. Bowel sounds are normal. There is no tenderness.   Neurological: He is alert and oriented to person, place, and time. He displays no weakness. No cranial nerve deficit or sensory deficit. He exhibits normal muscle tone. Coordination normal.   Skin: Skin is warm and dry. He is not diaphoretic (Subjective diaphoresis but not sweating in emergency department).   Psychiatric: His mood appears anxious.   Anxious   Nursing note and vitals reviewed.      Impression and Management Plan   Daily heroin user with signs of withdrawal.  COWS score currently 6.  He is asking for food and is hungry.  Will feed him.  IV fluids.  Check CBC, chemistry, hepatitis panel, tox screen, EtOH.  IV fluids and clonidine.  Reassess.    Diagnostic Studies   Lab:   Recent Results (from the past 12 hour(s))    CBC WITH AUTOMATED DIFF    Collection Time: 05/10/18  6:17 PM   Result Value Ref Range    WBC 10.2 4.0 - 11.0 1000/mm3    RBC 4.63 3.80 - 5.70 M/uL    HGB 14.7 12.4 - 17.2 gm/dl    HCT 42.2 37.0 - 50.0 %    MCV 91.1 80.0 - 98.0 fL    MCH 31.7 23.0 - 34.6 pg    MCHC 34.8 30.0 - 36.0 gm/dl    PLATELET 336 140 - 450 1000/mm3    MPV 9.0 6.0 - 10.0 fL    RDW-SD 43.5 35.1 - 43.9      NRBC 0 0 - 0      IMMATURE GRANULOCYTES 0.4 0.0 - 3.0 %    NEUTROPHILS 69.8 (H) 34 - 64 %    LYMPHOCYTES 21.5 (L) 28 - 48 %    MONOCYTES 6.0 1 - 13 %    EOSINOPHILS 1.8 0 - 5 %    BASOPHILS 0.5 0 - 3 %   METABOLIC PANEL, COMPREHENSIVE    Collection Time: 05/10/18  6:17 PM   Result Value Ref Range    Sodium 139 136 - 145 mEq/L    Potassium 3.7 3.5 - 5.1 mEq/L    Chloride 106 98 - 107 mEq/L    CO2 29 21 - 32 mEq/L    Glucose 86 74 - 106 mg/dl    BUN 12 7 - 25 mg/dl    Creatinine 0.8 0.6 - 1.3 mg/dl    GFR est AA >60      GFR est non-AA >60      Calcium 9.0 8.5 - 10.1 mg/dl    AST (SGOT) 27 15 - 37 U/L    ALT (SGPT) 42 12 - 78 U/L    Alk. phosphatase 62 45 - 117 U/L    Bilirubin, total 0.2 0.2 - 1.0 mg/dl    Protein, total 7.3 6.4 - 8.2 gm/dl    Albumin 3.6 3.4 - 5.0 gm/dl    Anion gap 5 5 - 15 mmol/L   ETHYL ALCOHOL    Collection Time: 05/10/18  6:17 PM   Result Value Ref Range    ALCOHOL(ETHYL),SERUM <3.0 0.0 - 9.0 mg/dl       Imaging:    No results  found.    Medical Decision Making/ED Course   Polysubstance abuse with heroin withdrawal.  Mild withdrawal symptoms at this time. COWS <7.  Patient reports that he would like to stop heroin but he is from [facility] not sure when he is returning.  [facility] holds appointments open on Mondays for patients.  Patient is given their contact information and proud program packet for further information and advised that he may follow-up with them tomorrow.    7:53 PM  When getting ready to discharge patient he tells nurse that he is  suicidal.  Evaluated patient and he tells me that he was brought up here from [facility] by a friend because he was having some depression and suicidal thoughts.  He is unsure where his friend is but that he is having ongoing thoughts of suicide.  We will keep him here for now and have psychiatric evaluation.    11:30 PM  Patient evaluated by tele-psychiatry, Dr. Lovena Neighbours.  (Please see his report).  He is psychiatrically cleared for discharge from the emergency department.  We will have him follow-up with [facility].    Final Diagnosis       ICD-10-CM ICD-9-CM   1. Polysubstance abuse ([place]) F19.10 305.90   2. Heroin withdrawal ([facility]) F11.23 292.0     304.00   3. Depression, unspecified depression type F32.9 311     Disposition   Discharge    Lindajo Royal, M.D.  May 10, 2018    *Portions of this electronic record were dictated using Systems analyst.  Unintended errors in translation may occur.    My signature above authenticates this document and my orders, the final ??  diagnosis (es), discharge prescription (s), and instructions in the Epic ??  record.  If you have any questions please contact [phone].  ??  Nursing notes have been reviewed by the physician/ advanced practice ??  Clinician.

## 2018-05-10 NOTE — ED Notes (Signed)
Patient's 3 bags placed in lockers 4/5 in west wing.

## 2018-05-10 NOTE — ED Notes (Signed)
11:52 PM  05/10/18     Discharge instructions given to patient (name) with verbalization of understanding. Patient accompanied by self.  Patient discharged with the [facility] and discharge papers. Patient discharged to home. (destination).      Hassel NethSarah Catherine Purdom

## 2018-05-10 NOTE — ED Notes (Signed)
Patient's belongings have been inventoried by security and placed in 3 lockers in west wing that are labeled bed 32/15. Patient's cell phone is plugged in in west wing nurses station behind the wheel chair where the wow is plugged in.

## 2018-05-10 NOTE — ED Notes (Signed)
Dr. Maryann ConnersHoyat given report about pt. Doctor talking to pt at this time using computer camera. Pt in low bed.     Dr. Percell MillerHorvat. SCP 05/10/2018.

## 2018-05-10 NOTE — ED Notes (Signed)
Dr. Percell MillerHorvat called back stating pt can be discharged. Dr. Kizzie BaneHughes made aware.

## 2018-05-10 NOTE — ED Notes (Signed)
Patient told Leanne that he wanted to harm himself and that he was depressed.

## 2018-05-10 NOTE — ED Provider Notes (Signed)
[place]  Emergency Department Treatment Report    Patient: Frederick Steele Age: 27 y.o. Sex: male    Date of Birth: 12-15-1990 Admit Date: 05/10/2018 PCP: None   MRN: [id number]  CSN: [id number]     Room: ER32/ER32 Time Dictated: 6:21 PM        Chief Complaint   Heroin withdrawal  History of Present Illness   27 y.o. male chronic heroin user with last use yesterday.  He reports he is a daily user but has been unable to get heroin today.  He is from [facility] and states that he was left here by "friend".  He is not sure how long he will be in town.  He does report that he wants help to stop heroin.  He injects this typically in his right upper extremity.  Patient also reports methamphetamine use.  Last use was 3 days ago.  He denies other drug use or alcohol.  He states that he is feeling anxious and nervous and sweating.  He denies abdominal pain, nausea, vomiting, muscle aches or joint aches or other signs or symptoms.  He is uncertain how long he will be in town.    Review of Systems   Review of Systems   Constitutional: Positive for diaphoresis. Negative for chills and fever.   HENT: Negative for sore throat.    Respiratory: Negative for cough and shortness of breath.    Cardiovascular: Negative for chest pain and leg swelling.   Gastrointestinal: Negative for abdominal pain, nausea and vomiting.   Genitourinary: Negative for dysuria and flank pain.   Musculoskeletal: Negative for joint pain and myalgias.   Skin: Negative for itching and rash.   Neurological: Negative for dizziness, focal weakness and headaches.   Psychiatric/Behavioral: Negative for depression. The patient is nervous/anxious.        Past Medical/Surgical History   No past medical history on file.  No past surgical history on file.    Social History     Social History     Socioeconomic History   ??? Marital status: SINGLE     Spouse name: Not on file   ??? Number of children: Not on file   ??? Years of education:  Not on file   ??? Highest education level: Not on file   Tobacco Use   ??? Smoking status: Current Every Day Smoker   ??? Smokeless tobacco: Never Used   Substance and Sexual Activity   ??? Alcohol use: Yes   ??? Drug use: Yes     Types: Heroin, Methamphetamines       Family History   No family history on file.    Current Medications         Allergies   No Known Allergies    Physical Exam     ED Triage Vitals   ED Encounter Vitals Group      BP 05/10/18 1742 107/80      Pulse (Heart Rate) 05/10/18 1742 99      Resp Rate 05/10/18 1757 18      Temp 05/10/18 1757 98.6 ??F (37 ??C)      Temp src --       O2 Sat (%) 05/10/18 1757 100 %      Weight 05/10/18 1757 145 lb      Height 05/10/18 1757 _0      Physical Exam   Constitutional: He is oriented to person, place, and time and well-developed, well-nourished, and in no  distress. No distress.   HENT:   Head: Normocephalic and atraumatic.   Eyes: Pupils are equal, round, and reactive to light. EOM are normal.   Cardiovascular: Normal rate, regular rhythm and normal heart sounds.   Pulmonary/Chest: Effort normal and breath sounds normal. No respiratory distress.   Abdominal: Soft. Bowel sounds are normal. There is no tenderness.   Neurological: He is alert and oriented to person, place, and time. He displays no weakness. No cranial nerve deficit or sensory deficit. He exhibits normal muscle tone. Coordination normal.   Skin: Skin is warm and dry. He is not diaphoretic (Subjective diaphoresis but not sweating in emergency department).   Psychiatric: His mood appears anxious.   Anxious   Nursing note and vitals reviewed.      Impression and Management Plan   Daily heroin user with signs of withdrawal.  COWS score currently 6.  He is asking for food and is hungry.  Will feed him.  IV fluids.  Check CBC, chemistry, hepatitis panel, tox screen, EtOH.  IV fluids and clonidine.  Reassess.    Diagnostic Studies   Lab:   Recent Results (from the past 12 hour(s))   CBC WITH AUTOMATED DIFF     Collection Time: 05/10/18  6:17 PM   Result Value Ref Range    WBC 10.2 4.0 - 11.0 1000/mm3    RBC 4.63 3.80 - 5.70 M/uL    HGB 14.7 12.4 - 17.2 gm/dl    HCT 42.2 37.0 - 50.0 %    MCV 91.1 80.0 - 98.0 fL    MCH 31.7 23.0 - 34.6 pg    MCHC 34.8 30.0 - 36.0 gm/dl    PLATELET 336 140 - 450 1000/mm3    MPV 9.0 6.0 - 10.0 fL    RDW-SD 43.5 35.1 - 43.9      NRBC 0 0 - 0      IMMATURE GRANULOCYTES 0.4 0.0 - 3.0 %    NEUTROPHILS 69.8 (H) 34 - 64 %    LYMPHOCYTES 21.5 (L) 28 - 48 %    MONOCYTES 6.0 1 - 13 %    EOSINOPHILS 1.8 0 - 5 %    BASOPHILS 0.5 0 - 3 %   METABOLIC PANEL, COMPREHENSIVE    Collection Time: 05/10/18  6:17 PM   Result Value Ref Range    Sodium 139 136 - 145 mEq/L    Potassium 3.7 3.5 - 5.1 mEq/L    Chloride 106 98 - 107 mEq/L    CO2 29 21 - 32 mEq/L    Glucose 86 74 - 106 mg/dl    BUN 12 7 - 25 mg/dl    Creatinine 0.8 0.6 - 1.3 mg/dl    GFR est AA >60      GFR est non-AA >60      Calcium 9.0 8.5 - 10.1 mg/dl    AST (SGOT) 27 15 - 37 U/L    ALT (SGPT) 42 12 - 78 U/L    Alk. phosphatase 62 45 - 117 U/L    Bilirubin, total 0.2 0.2 - 1.0 mg/dl    Protein, total 7.3 6.4 - 8.2 gm/dl    Albumin 3.6 3.4 - 5.0 gm/dl    Anion gap 5 5 - 15 mmol/L   ETHYL ALCOHOL    Collection Time: 05/10/18  6:17 PM   Result Value Ref Range    ALCOHOL(ETHYL),SERUM <3.0 0.0 - 9.0 mg/dl       Imaging:    No results  found.    Medical Decision Making/ED Course   Polysubstance abuse with heroin withdrawal.  Mild withdrawal symptoms at this time. COWS <7.  Patient reports that he would like to stop heroin but he is from [facility] not sure when he is returning.  [facility] holds appointments open on Mondays for patients.  Patient is given their contact information and proud program packet for further information and advised that he may follow-up with them tomorrow.    7:53 PM  When getting ready to discharge patient he tells nurse that he is suicidal.  Evaluated patient and he tells me that he was brought up  here from [facility] by a friend because he was having some depression and suicidal thoughts.  He is unsure where his friend is but that he is having ongoing thoughts of suicide.  We will keep him here for now and have psychiatric evaluation.    11:30 PM  Patient evaluated by tele-psychiatry, Dr. Lovena Neighbours.  (Please see his report).  He is psychiatrically cleared for discharge from the emergency department.  We will have him follow-up with [facility].    Final Diagnosis       ICD-10-CM ICD-9-CM   1. Polysubstance abuse ([place]) F19.10 305.90   2. Heroin withdrawal ([facility]) F11.23 292.0     304.00   3. Depression, unspecified depression type F32.9 311     Disposition   Discharge    Lindajo Royal, M.D.  May 10, 2018    *Portions of this electronic record were dictated using Systems analyst.  Unintended errors in translation may occur.    My signature above authenticates this document and my orders, the final ??  diagnosis (es), discharge prescription (s), and instructions in the Epic ??  record.  If you have any questions please contact [phone].  ??  Nursing notes have been reviewed by the physician/ advanced practice ??  Clinician.

## 2018-05-10 NOTE — ED Notes (Signed)
Pt seen and discharged by provider. Pt stated to care partner that he feels depressed and is thinking of ending his life. Charge nurse informed.    Pts belongings already locked up. Pt will be placed in a paper gown and reevaluated by provider for need for Psyc consult.

## 2018-05-10 NOTE — ED Notes (Signed)
This nurse received patient from the main side with a complaint of SI to the nurse that had him. Waiting for report from nurse.

## 2018-05-10 NOTE — ED Notes (Signed)
Psych consult form faxed and [facility] called. Pt in low bed and on monitors.

## 2018-05-10 NOTE — ED Notes (Signed)
Pt's belongings given back to pt from lockers as well as phone and [employer] plannerphone charger.

## 2018-05-10 NOTE — ED Notes (Signed)
Pt picked up by EMS from [facility] in [place].  Pt visiting from [place]  Pt seeking help from withdrawals - Heroin- Last used yesterday. 1/2 -1 gm dalily for the last 2 years.

## 2018-05-11 ENCOUNTER — Inpatient Hospital Stay
Admit: 2018-05-11 | Discharge: 2018-05-11 | Disposition: A | Discharge status: Home / Self Care | Payer: Self-pay | Attending: Emergency Medicine

## 2018-05-11 DIAGNOSIS — F1910 Other psychoactive substance abuse, uncomplicated: Secondary | ICD-10-CM

## 2018-05-11 LAB — DRUG SCREEN, URINE
Amphetamine: POSITIVE — AB
Amphetamines: POSITIVE — AB
Barbiturates: NEGATIVE
Barbiturates: NEGATIVE
Benzodiazapines: NEGATIVE
Benzodiazepines: NEGATIVE
Cocaine: NEGATIVE
Cocaine: NEGATIVE
Marijuana: POSITIVE — AB
Marijuana: POSITIVE — AB
Methadone: NEGATIVE
Methadone: NEGATIVE
Opiates: POSITIVE — AB
Opiates: POSITIVE — AB
Phencyclidine: NEGATIVE
Phencyclidine: NEGATIVE

## 2018-05-11 LAB — HEPATITIS PANEL, ACUTE
HCV Ab: REACTIVE — AB
Hep A IgM: NONREACTIVE
Hep B Core Ab, IgM: NONREACTIVE
Hepatitis A, IgM: NONREACTIVE
Hepatitis B Surface Ag: NONREACTIVE
Hepatitis B core, IgM: NONREACTIVE
Hepatitis B surface Ag: NONREACTIVE
Hepatitis C virus Ab: REACTIVE — AB
SIGNAL TO CUTOFF (HEP C): >11
Signal to Cutoff (Hep C): >11

## 2018-05-11 NOTE — ED Notes (Addendum)
1:51 AM  05/11/18     Discharge instructions given to patient (name) with verbalization of understanding. Patient accompanied by pt.  Patient discharged with the following discharge papers and [place] crisis number. Pt signed contract for safety that is in pt chart.  [employer] have agreed to give him a ride to [place]. (destination).      Sarah Catherine Purdom

## 2018-05-11 NOTE — ED Provider Notes (Signed)
[place]  Emergency Department Treatment Report    Patient: Frederick Steele Age: 27 y.o. Sex: male    Date of Birth: 1991/01/06 Admit Date: 05/11/2018 PCP: None   MRN: [id number]  CSN: [id number]     Room: ER18/ER18 Time Dictated: 1:30 AM        Chief Complaint   Polysubstance abuse, depression  History of Present Illness   27 y.o. male presents complaining of depression and report of suicidal ideation.  Patient presented in emergency department earlier today claiming heroin withdrawal and stating that he was seeking help.  He did not have significant symptoms of heroin withdrawal and was to be discharged to be seen at [facility].  On discharge at that time he stated that if he was discharged he was going to harm himself.  He was evaluated by tele-psychiatry and not deemed to be detainable or appropriate for inpatient care.  Symptoms appear to be situational.  This evening patient ran into the lobby.  It was later learned that patient was seen trying to break into cars and a lock box.  He was chased by a citizen who called the police.  Patient then ran into emergency department waiting room and again stated that he was suicidal.  He states to me that he has not done anything to harm himself and does not report specific plan.  He does not appear to have significant withdrawal symptoms substances though tox screen from earlier visit is positive for methamphetamine, opiates, marijuana.  He denies physical pain.    Review of Systems   Review of Systems   Constitutional: Negative for chills and fever.   HENT: Negative for sore throat.    Respiratory: Negative for cough and shortness of breath.    Cardiovascular: Negative for chest pain and leg swelling.   Gastrointestinal: Negative for abdominal pain, nausea and vomiting.   Genitourinary: Negative for dysuria and flank pain.   Musculoskeletal: Negative for joint pain and myalgias.   Skin: Negative for itching and rash.    Neurological: Negative for dizziness, focal weakness and headaches.   Psychiatric/Behavioral: Positive for depression and substance abuse. The patient is not nervous/anxious.        Past Medical/Surgical History   History reviewed. No pertinent past medical history.  History reviewed. No pertinent surgical history.    Social History     Social History     Socioeconomic History   ??? Marital status: SINGLE     Spouse name: Not on file   ??? Number of children: Not on file   ??? Years of education: Not on file   ??? Highest education level: Not on file   Tobacco Use   ??? Smoking status: Current Every Day Smoker   ??? Smokeless tobacco: Never Used   Substance and Sexual Activity   ??? Alcohol use: Yes   ??? Drug use: Yes     Types: Heroin, Methamphetamines       Family History   History reviewed. No pertinent family history.    Current Medications       Allergies   No Known Allergies    Physical Exam     ED Triage Vitals [05/11/18 0048]   ED Encounter Vitals Group      BP 111/70      Pulse (Heart Rate) 86      Resp Rate 18      Temp 98.1 ??F (36.7 ??C)      Temp src  O2 Sat (%) 98 %      Weight 145 lb      Height 5' 7"     Physical Exam   Constitutional: He is oriented to person, place, and time and well-developed, well-nourished, and in no distress. No distress.   HENT:   Head: Normocephalic and atraumatic.   Cardiovascular: Normal rate, regular rhythm and normal heart sounds.   Pulmonary/Chest: Effort normal and breath sounds normal. No respiratory distress.   Abdominal: Soft. Bowel sounds are normal. There is no tenderness.   Musculoskeletal: He exhibits no edema or deformity.   Neurological: He is alert and oriented to person, place, and time. He exhibits normal muscle tone.   Skin: Skin is warm and dry.   Psychiatric: Affect normal.   Nursing note and vitals reviewed.      Impression and Management Plan   Symptoms of depression and wanting to harm himself all appear to be  situational but will ask psychiatric crisis worker to evaluate patient.  No indication the patient has done anything to harm himself.  Benign exam and normal vital signs.    Diagnostic Studies   Lab: Labs from earlier visit this evening  Recent Results (from the past 12 hour(s))   CBC WITH AUTOMATED DIFF    Collection Time: 05/10/18  6:17 PM   Result Value Ref Range    WBC 10.2 4.0 - 11.0 1000/mm3    RBC 4.63 3.80 - 5.70 M/uL    HGB 14.7 12.4 - 17.2 gm/dl    HCT 42.2 37.0 - 50.0 %    MCV 91.1 80.0 - 98.0 fL    MCH 31.7 23.0 - 34.6 pg    MCHC 34.8 30.0 - 36.0 gm/dl    PLATELET 336 140 - 450 1000/mm3    MPV 9.0 6.0 - 10.0 fL    RDW-SD 43.5 35.1 - 43.9      NRBC 0 0 - 0      IMMATURE GRANULOCYTES 0.4 0.0 - 3.0 %    NEUTROPHILS 69.8 (H) 34 - 64 %    LYMPHOCYTES 21.5 (L) 28 - 48 %    MONOCYTES 6.0 1 - 13 %    EOSINOPHILS 1.8 0 - 5 %    BASOPHILS 0.5 0 - 3 %   METABOLIC PANEL, COMPREHENSIVE    Collection Time: 05/10/18  6:17 PM   Result Value Ref Range    Sodium 139 136 - 145 mEq/L    Potassium 3.7 3.5 - 5.1 mEq/L    Chloride 106 98 - 107 mEq/L    CO2 29 21 - 32 mEq/L    Glucose 86 74 - 106 mg/dl    BUN 12 7 - 25 mg/dl    Creatinine 0.8 0.6 - 1.3 mg/dl    GFR est AA >60      GFR est non-AA >60      Calcium 9.0 8.5 - 10.1 mg/dl    AST (SGOT) 27 15 - 37 U/L    ALT (SGPT) 42 12 - 78 U/L    Alk. phosphatase 62 45 - 117 U/L    Bilirubin, total 0.2 0.2 - 1.0 mg/dl    Protein, total 7.3 6.4 - 8.2 gm/dl    Albumin 3.6 3.4 - 5.0 gm/dl    Anion gap 5 5 - 15 mmol/L   ETHYL ALCOHOL    Collection Time: 05/10/18  6:17 PM   Result Value Ref Range    ALCOHOL(ETHYL),SERUM <3.0 0.0 - 9.0 mg/dl  DRUG SCREEN, URINE    Collection Time: 05/10/18  7:20 PM   Result Value Ref Range    Amphetamine POSITIVE (A) NEGATIVE      Barbiturates NEGATIVE NEGATIVE      Benzodiazepines NEGATIVE NEGATIVE      Cocaine NEGATIVE NEGATIVE      Marijuana POSITIVE (A) NEGATIVE      Methadone NEGATIVE NEGATIVE      Opiates POSITIVE (A) NEGATIVE       Phencyclidine NEGATIVE NEGATIVE         Imaging:    No results found.    Medical Decision Making/ED Course   Patient remains medically stable through emergency department evaluation observation.  He is evaluated by psychiatric crisis worker and not deemed detainable or appropriate for inpatient therapy.  Safety plan is given.  He is discharged for outpatient follow-up.  Patient was given information for [facility] for substance abuse earlier this evening.    Final Diagnosis       ICD-10-CM ICD-9-CM   1. Polysubstance abuse ([place]) F19.10 305.90   2. Situational depression F43.21 309.0     Disposition   Discharge    Lindajo Royal, M.D.  May 11, 2018    *Portions of this electronic record were dictated using Systems analyst.  Unintended errors in translation may occur.    My signature above authenticates this document and my orders, the final ??  diagnosis (es), discharge prescription (s), and instructions in the Epic ??  record.  If you have any questions please contact [phone].  ??  Nursing notes have been reviewed by the physician/ advanced practice ??  Clinician.

## 2018-05-11 NOTE — ED Triage Notes (Signed)
Pt here for SI thoughts-wants to cut his wrists, was here yesterday and was told to come back for SI thoughts, states he has not been taking his meds for the past 3 months.

## 2018-05-11 NOTE — ED Notes (Signed)
Patient was discharged after tele psych evaluation. He returned stating that he would hurt himself. He was being pursued by the police. Police officer cam in to talk to client. He has been seen by Dr Hughes and is speaking with [facility] at this time.

## 2018-05-11 NOTE — Progress Notes (Signed)
MENTAL HEALTH CONTACT: Chart and prior psych consult reviewed. Case discussed with [facility] attending MD, Dr. Kizzie BaneHughes and assigned RN, Chip BoerVicki. Patient is a resident of [place] friends in the area who transported him [facility] on 05/10/18 at 1754. Patient seeking help for opiate detox. PROUD assessment and referral completed and upon discharged patient reported suicidal ideation. Patient then evaluated by Dr. Rob BuntingHorwath via tele psych services and referred for out-patient treatment.   Patient returns for this encounter [place] [facility].([employer]), Officers Filomena JunglingGrisbela and Iran PlanasGilman, in pursuit of a suspect for attempted larceny in the local community surrounding the hospital complex. Patient reportedly fit the description provided by concerned citizens in 911 call. Per CPD contact, patient has no outstanding charges at this time.  Patient is A&O X4.  The patient is irritable and defensive stating to police and staff "I didn't do nothing wrong!" He offers little self disclosure and states "he was played" by his friends and "something about a girl....but you don't need to know that". The patient is anxiously distressed reporting he cannot return to the local hotel where (per police)  friends are currently  staying. He is angry as he states " I'm homeless!!.Marland Kitchen.Marland Kitchen.I just need some help!"  The patient is not willing to discus his polysubstance use issues which include methamphetamine, heroin and marijuana. Per Dr. Kizzie BaneHughes, the patient is clinically stable for O/P follow-up.  Risk assessment completed. Patient reports he has been thinking about harming himself but has no plan. There are no clearly defined safety issues at this time.  Attempts to contact patient's mother, Thomas HoffCrystal Reynolds [phone] unsuccessful. Patient stated his mother would probably pick him up when she can be reached.  PLAN: Case reviewed with [facility] attending MD, Dr. Kizzie BaneHughes.The patient does  not meet any in-patient or TDO criteria at this time. He is a voluntary,uninsured resident of [place]. [employer] 24 hour mobile crisis services reviewed with phone outreach to on call clinician, Lenox AhrKecia Adams to advise of patient referral. Safety plan established with [employer] [place]. Patient instructed to call mobile crisis [place] crisis support and treatment referral. Plan for patient discharge. CPD, Officer Filomena JunglingGrisbela contacted to provide requested police transport.

## 2018-05-11 NOTE — ED Notes (Signed)
Pt bags and clothes given back to pt. Pt given papers and [place] Crisis number. Pt given water and lunchbox.

## 2018-05-11 NOTE — ED Notes (Signed)
Pt in scrubs and non slip socks in room, clothes as well as bags labeled, bagged. Pt in low bed.

## 2018-05-11 NOTE — ED Notes (Signed)
Security checked bags, bag list on pt's chart. Bags labeled and locked in lockers with label 15. Pt in low bed.

## 2018-05-11 NOTE — ED Notes (Signed)
Pt has phone to have to call crisis center in [place]. Waiting for crisis center number. Pt in low bed.

## 2018-05-11 NOTE — ED Notes (Signed)
Pt provided blanket. Pt in low bed.

## 2018-05-11 NOTE — ED Notes (Signed)
1:51 AM  05/11/18     Discharge instructions given to patient (name) with verbalization of understanding. Patient accompanied by pt.  Patient discharged with the following discharge papers and [place] crisis number. Pt signed contract for safety that is in pt chart.  CPD have agreed to give him a ride to [facility]. (destination).      Hassel Neth Purdom

## 2018-05-11 NOTE — ED Provider Notes (Signed)
[facility]  Emergency Department Treatment Report    Patient: Frederick Steele Age: 27 y.o. Sex: male    Date of Birth: 1991-10-16 Admit Date: 05/11/2018 PCP: None   MRN: [id number]  CSN: [id number]     Room: ER18/ER18 Time Dictated: 1:30 AM        Chief Complaint   Polysubstance abuse, depression  History of Present Illness   27 y.o. male presents complaining of depression and report of suicidal ideation.  Patient presented in emergency department earlier today claiming heroin withdrawal and stating that he was seeking help.  He did not have significant symptoms of heroin withdrawal and was to be discharged to be seen at [facility] psychotherapy.  On discharge at that time he stated that if he was discharged he was going to harm himself.  He was evaluated by tele-psychiatry and not deemed to be detainable or appropriate for inpatient care.  Symptoms appear to be situational.  This evening patient ran into the lobby.  It was later learned that patient was seen trying to break into cars and a lock box.  He was chased by a citizen who called the police.  Patient then ran into emergency department waiting room and again stated that he was suicidal.  He states to me that he has not done anything to harm himself and does not report specific plan.  He does not appear to have significant withdrawal symptoms substances though tox screen from earlier visit is positive for methamphetamine, opiates, marijuana.  He denies physical pain.    Review of Systems   Review of Systems   Constitutional: Negative for chills and fever.   HENT: Negative for sore throat.    Respiratory: Negative for cough and shortness of breath.    Cardiovascular: Negative for chest pain and leg swelling.   Gastrointestinal: Negative for abdominal pain, nausea and vomiting.   Genitourinary: Negative for dysuria and flank pain.   Musculoskeletal: Negative for joint pain and myalgias.   Skin: Negative for itching and rash.   Neurological:  Negative for dizziness, focal weakness and headaches.   Psychiatric/Behavioral: Positive for depression and substance abuse. The patient is not nervous/anxious.        Past Medical/Surgical History   History reviewed. No pertinent past medical history.  History reviewed. No pertinent surgical history.    Social History     Social History     Socioeconomic History   ??? Marital status: SINGLE     Spouse name: Not on file   ??? Number of children: Not on file   ??? Years of education: Not on file   ??? Highest education level: Not on file   Tobacco Use   ??? Smoking status: Current Every Day Smoker   ??? Smokeless tobacco: Never Used   Substance and Sexual Activity   ??? Alcohol use: Yes   ??? Drug use: Yes     Types: Heroin, Methamphetamines       Family History   History reviewed. No pertinent family history.    Current Medications       Allergies   No Known Allergies    Physical Exam     ED Triage Vitals [05/11/18 0048]   ED Encounter Vitals Group      BP 111/70      Pulse (Heart Rate) 86      Resp Rate 18      Temp 98.1 ??F (36.7 ??C)      Temp src  O2 Sat (%) 98 %      Weight 145 lb      Height '5\' 7"'$      Physical Exam   Constitutional: He is oriented to person, place, and time and well-developed, well-nourished, and in no distress. No distress.   HENT:   Head: Normocephalic and atraumatic.   Cardiovascular: Normal rate, regular rhythm and normal heart sounds.   Pulmonary/Chest: Effort normal and breath sounds normal. No respiratory distress.   Abdominal: Soft. Bowel sounds are normal. There is no tenderness.   Musculoskeletal: He exhibits no edema or deformity.   Neurological: He is alert and oriented to person, place, and time. He exhibits normal muscle tone.   Skin: Skin is warm and dry.   Psychiatric: Affect normal.   Nursing note and vitals reviewed.      Impression and Management Plan   Symptoms of depression and wanting to harm himself all appear to be situational but will ask psychiatric crisis worker to evaluate  patient.  No indication the patient has done anything to harm himself.  Benign exam and normal vital signs.    Diagnostic Studies   Lab: Labs from earlier visit this evening  Recent Results (from the past 12 hour(s))   CBC WITH AUTOMATED DIFF    Collection Time: 05/10/18  6:17 PM   Result Value Ref Range    WBC 10.2 4.0 - 11.0 1000/mm3    RBC 4.63 3.80 - 5.70 M/uL    HGB 14.7 12.4 - 17.2 gm/dl    HCT 42.2 37.0 - 50.0 %    MCV 91.1 80.0 - 98.0 fL    MCH 31.7 23.0 - 34.6 pg    MCHC 34.8 30.0 - 36.0 gm/dl    PLATELET 336 140 - 450 1000/mm3    MPV 9.0 6.0 - 10.0 fL    RDW-SD 43.5 35.1 - 43.9      NRBC 0 0 - 0      IMMATURE GRANULOCYTES 0.4 0.0 - 3.0 %    NEUTROPHILS 69.8 (H) 34 - 64 %    LYMPHOCYTES 21.5 (L) 28 - 48 %    MONOCYTES 6.0 1 - 13 %    EOSINOPHILS 1.8 0 - 5 %    BASOPHILS 0.5 0 - 3 %   METABOLIC PANEL, COMPREHENSIVE    Collection Time: 05/10/18  6:17 PM   Result Value Ref Range    Sodium 139 136 - 145 mEq/L    Potassium 3.7 3.5 - 5.1 mEq/L    Chloride 106 98 - 107 mEq/L    CO2 29 21 - 32 mEq/L    Glucose 86 74 - 106 mg/dl    BUN 12 7 - 25 mg/dl    Creatinine 0.8 0.6 - 1.3 mg/dl    GFR est AA >60      GFR est non-AA >60      Calcium 9.0 8.5 - 10.1 mg/dl    AST (SGOT) 27 15 - 37 U/L    ALT (SGPT) 42 12 - 78 U/L    Alk. phosphatase 62 45 - 117 U/L    Bilirubin, total 0.2 0.2 - 1.0 mg/dl    Protein, total 7.3 6.4 - 8.2 gm/dl    Albumin 3.6 3.4 - 5.0 gm/dl    Anion gap 5 5 - 15 mmol/L   ETHYL ALCOHOL    Collection Time: 05/10/18  6:17 PM   Result Value Ref Range    ALCOHOL(ETHYL),SERUM <3.0 0.0 - 9.0 mg/dl  DRUG SCREEN, URINE    Collection Time: 05/10/18  7:20 PM   Result Value Ref Range    Amphetamine POSITIVE (A) NEGATIVE      Barbiturates NEGATIVE NEGATIVE      Benzodiazepines NEGATIVE NEGATIVE      Cocaine NEGATIVE NEGATIVE      Marijuana POSITIVE (A) NEGATIVE      Methadone NEGATIVE NEGATIVE      Opiates POSITIVE (A) NEGATIVE      Phencyclidine NEGATIVE NEGATIVE         Imaging:    No results  found.    Medical Decision Making/ED Course   Patient remains medically stable through emergency department evaluation observation.  He is evaluated by psychiatric crisis worker and not deemed detainable or appropriate for inpatient therapy.  Safety plan is given.  He is discharged for outpatient follow-up.  Patient was given information for [facility] psychotherapy for substance abuse earlier this evening.    Final Diagnosis       ICD-10-CM ICD-9-CM   1. Polysubstance abuse ([place]) F19.10 305.90   2. Situational depression F43.21 309.0     Disposition   Discharge    Lindajo Royal, M.D.  May 11, 2018    *Portions of this electronic record were dictated using Systems analyst.  Unintended errors in translation may occur.    My signature above authenticates this document and my orders, the final ??  diagnosis (es), discharge prescription (s), and instructions in the Epic ??  record.  If you have any questions please contact [phone].  ??  Nursing notes have been reviewed by the physician/ advanced practice ??  Clinician.

## 2018-05-11 NOTE — ED Notes (Signed)
Pt has phone to have to call crisis center in [place]. Waiting for crisis center number. Pt in low bed.

## 2018-05-11 NOTE — ED Notes (Signed)
Pt here for SI thoughts-wants to cut his wrists, was here yesterday and was told to come back for SI thoughts, states he has not been taking his meds for the past 3 months.

## 2018-05-11 NOTE — ED Notes (Signed)
Security checked bags, bag list on pt's chart. Bags labeled and locked in lockers with label 15. Pt in low bed.

## 2018-05-11 NOTE — ED Notes (Signed)
Pt bags and clothes given back to pt. Pt given papers and [place] Crisis number. Pt given water and lunchbox.

## 2018-05-11 NOTE — ED Notes (Signed)
Pt in scrubs and non slip socks in room, clothes as well as bags labeled, bagged. Pt in low bed.

## 2018-05-11 NOTE — Progress Notes (Signed)
 MENTAL HEALTH CONTACT: Chart and prior psych consult reviewed. Case discussed with [facility] attending MD, Dr. Vinita and assigned RN, Orie. Patient is a resident of [place], [place]. visiting friends in the area who transported him to the [facility] ED on 05/10/18 at 1754. Patient seeking help for opiate detox. PROUD assessment and referral completed and upon discharged patient reported suicidal ideation. Patient then evaluated by Dr. Erica via tele psych services and referred for out-patient treatment.   Patient returns for this encounter followed by [facility] Dept.(CPD), Officers Hezzie and Gilman, in pursuit of a suspect for attempted larceny in the local community surrounding the hospital complex. Patient reportedly fit the description provided by concerned citizens in 911 call. Per CPD contact, patient has no outstanding charges at this time.  Patient is A&O X4.  The patient is irritable and defensive stating to police and staff I didn't do nothing wrong! He offers little self disclosure and states he was played by his friends and something about a girl....but you don't need to know that. The patient is anxiously distressed reporting he cannot return to the local hotel where (per police)  friends are currently  staying. He is angry as he states  I'm homeless!!.SABRASABRAI just need some help!  The patient is not willing to discus his polysubstance use issues which include methamphetamine, heroin and marijuana. Per Dr. Vinita, the patient is clinically stable for O/P follow-up.  Risk assessment completed. Patient reports he has been thinking about harming himself but has no plan. There are no clearly defined safety issues at this time.  Attempts to contact patient's mother, Camelia Hock [phone] unsuccessful. Patient stated his mother would probably pick him up when she can be reached.  PLAN: Case reviewed with [facility] attending MD, Dr. Vinita.The patient does not meet any in-patient or TDO criteria at  this time. He is a voluntary,uninsured resident of [place] . [place]  24 hour mobile crisis services reviewed with phone outreach to on call clinician, Adolph Clause to advise of patient referral. Safety plan established with CPD transport to [place]  state line. Patient instructed to call mobile crisis when he physically returns to [place]  for crisis support and treatment referral. Plan for patient discharge. [employer], Officer Hezzie contacted to provide requested police transport.

## 2018-05-11 NOTE — ED Notes (Signed)
Pt provided blanket. Pt in low bed.

## 2018-05-11 NOTE — ED Notes (Signed)
Patient was discharged after tele psych evaluation. He returned stating that he would hurt himself. He was being pursued by the police. Police officer cam in to talk to client. He has been seen by Dr Kizzie Bane and is speaking with [facility] Chip Boer at this time.

## 2019-06-08 ENCOUNTER — Emergency Department (HOSPITAL_COMMUNITY)
Admission: EM | Admit: 2019-06-08 | Discharge: 2019-06-08 | Disposition: A | Discharge status: Home / Self Care | Attending: Emergency Medicine | Admitting: Emergency Medicine

## 2019-06-08 ENCOUNTER — Emergency Department (HOSPITAL_COMMUNITY)

## 2019-06-08 ENCOUNTER — Encounter (HOSPITAL_COMMUNITY): Payer: Self-pay | Admitting: Emergency Medicine

## 2019-06-08 DIAGNOSIS — F17210 Nicotine dependence, cigarettes, uncomplicated: Secondary | ICD-10-CM | POA: Diagnosis not present

## 2019-06-08 DIAGNOSIS — F1910 Other psychoactive substance abuse, uncomplicated: Secondary | ICD-10-CM | POA: Insufficient documentation

## 2019-06-08 DIAGNOSIS — F1210 Cannabis abuse, uncomplicated: Secondary | ICD-10-CM | POA: Insufficient documentation

## 2019-06-08 DIAGNOSIS — R4182 Altered mental status, unspecified: Secondary | ICD-10-CM | POA: Insufficient documentation

## 2019-06-08 DIAGNOSIS — F1410 Cocaine abuse, uncomplicated: Secondary | ICD-10-CM | POA: Insufficient documentation

## 2019-06-08 DIAGNOSIS — F1510 Other stimulant abuse, uncomplicated: Secondary | ICD-10-CM | POA: Diagnosis not present

## 2019-06-08 DIAGNOSIS — F17228 Nicotine dependence, chewing tobacco, with other nicotine-induced disorders: Secondary | ICD-10-CM | POA: Diagnosis not present

## 2019-06-08 HISTORY — DX: Epilepsy, unspecified, not intractable, without status epilepticus: G40.909

## 2019-06-08 LAB — CBC WITH DIFFERENTIAL/PLATELET
Abs Immature Granulocytes: 0.04 10*3/uL (ref 0.00–0.07)
Basophils Absolute: 0 10*3/uL (ref 0.0–0.1)
Basophils Relative: 0 %
Eosinophils Absolute: 0 10*3/uL (ref 0.0–0.5)
Eosinophils Relative: 0 %
HCT: 45.2 % (ref 39.0–52.0)
Hemoglobin: 15.7 g/dL (ref 13.0–17.0)
Immature Granulocytes: 1 %
Lymphocytes Relative: 38 %
Lymphs Abs: 2.9 10*3/uL (ref 0.7–4.0)
MCH: 30.2 pg (ref 26.0–34.0)
MCHC: 34.7 g/dL (ref 30.0–36.0)
MCV: 86.9 fL (ref 80.0–100.0)
Monocytes Absolute: 0.5 10*3/uL (ref 0.1–1.0)
Monocytes Relative: 7 %
Neutro Abs: 4.1 10*3/uL (ref 1.7–7.7)
Neutrophils Relative %: 54 %
Platelets: 292 10*3/uL (ref 150–400)
RBC: 5.2 MIL/uL (ref 4.22–5.81)
RDW: 12.9 % (ref 11.5–15.5)
WBC: 7.6 10*3/uL (ref 4.0–10.5)
nRBC: 0 % (ref 0.0–0.2)

## 2019-06-08 LAB — COMPREHENSIVE METABOLIC PANEL
ALT: 29 U/L (ref 0–44)
AST: 21 U/L (ref 15–41)
Albumin: 3.6 g/dL (ref 3.5–5.0)
Alkaline Phosphatase: 46 U/L (ref 38–126)
Anion gap: 10 (ref 5–15)
BUN: 9 mg/dL (ref 6–20)
CO2: 24 mmol/L (ref 22–32)
Calcium: 9 mg/dL (ref 8.9–10.3)
Chloride: 105 mmol/L (ref 98–111)
Creatinine, Ser: 0.74 mg/dL (ref 0.61–1.24)
GFR calc Af Amer: >60 mL/min (ref >60)
GFR calc non Af Amer: >60 mL/min (ref >60)
Glucose, Bld: 92 mg/dL (ref 70–99)
Potassium: 3.8 mmol/L (ref 3.5–5.1)
Sodium: 139 mmol/L (ref 135–145)
Total Bilirubin: 0.5 mg/dL (ref 0.3–1.2)
Total Protein: 6.8 g/dL (ref 6.5–8.1)

## 2019-06-08 LAB — URINALYSIS, ROUTINE W REFLEX MICROSCOPIC
Bilirubin Urine: NEGATIVE
Glucose, UA: NEGATIVE mg/dL
Hgb urine dipstick: NEGATIVE
Ketones, ur: NEGATIVE mg/dL
Leukocytes,Ua: NEGATIVE
Nitrite: NEGATIVE
Protein, ur: NEGATIVE mg/dL
Specific Gravity, Urine: 1.02 (ref 1.005–1.030)
pH: 6 (ref 5.0–8.0)

## 2019-06-08 LAB — CBG MONITORING, ED: Glucose-Capillary: 74 mg/dL (ref 70–99)

## 2019-06-08 LAB — RAPID URINE DRUG SCREEN, HOSP PERFORMED
Amphetamines: NOT DETECTED
Barbiturates: NOT DETECTED
Benzodiazepines: NOT DETECTED
Cocaine: NOT DETECTED
Opiates: NOT DETECTED
Tetrahydrocannabinol: NOT DETECTED

## 2019-06-08 LAB — ETHANOL: Alcohol, Ethyl (B): <10 mg/dL (ref <10)

## 2019-06-08 LAB — AMMONIA: Ammonia: 40 umol/L — ABNORMAL HIGH (ref 9–35)

## 2019-06-08 LAB — SALICYLATE LEVEL: Salicylate Lvl: <7 mg/dL (ref 2.8–30.0)

## 2019-06-08 LAB — MAGNESIUM: Magnesium: 2.2 mg/dL (ref 1.7–2.4)

## 2019-06-08 LAB — ACETAMINOPHEN LEVEL: Acetaminophen (Tylenol), Serum: <10 ug/mL — ABNORMAL LOW (ref 10–30)

## 2019-06-08 MED ORDER — NALOXONE HCL 0.4 MG/ML IJ SOLN
0.4000 mg | Freq: Once | INTRAMUSCULAR | Status: AC
  Administered 2019-06-08: 0.4 mg via INTRAVENOUS

## 2019-06-08 MED ORDER — LEVETIRACETAM 500 MG PO TABS: 500.0000 mg | ORAL_TABLET | Freq: Two times a day (BID) | ORAL | 2 refills | Status: DC

## 2019-06-08 MED ORDER — IOHEXOL 300 MG/ML  SOLN: 100.0000 mL | Freq: Once | INTRAMUSCULAR | Status: DC | PRN

## 2019-06-08 MED ORDER — LEVETIRACETAM IN NACL 1000 MG/100ML IV SOLN
1000.0000 mg | Freq: Once | INTRAVENOUS | Status: AC
  Administered 2019-06-08: 1000 mg via INTRAVENOUS
  Filled 2019-06-08: qty 100

## 2019-06-08 NOTE — ED Notes (Signed)
ED Provider at bedside. 

## 2019-06-08 NOTE — Discharge Instructions (Signed)
We suspect you may have had a seizure. Begin taking the Keppra, as prescribed. Follow-up with neurology for any further management of this issue.

## 2019-06-08 NOTE — ED Notes (Signed)
Pt discharged from ED; instructions provided and scripts given; Pt encouraged to return to ED if symptoms worsen and to f/u with PCP; Pt verbalized understanding of all instructions 

## 2019-06-08 NOTE — ED Triage Notes (Addendum)
[facility] with [facility] from [facility] found in the cell supine with head against the wall, tracts with his eye but not speaking. No signs noted, does have hx of seizures and is not taking any meds. No incontinence or oral trauma noted. 12 Lead NSR. VSS.

## 2019-06-08 NOTE — ED Provider Notes (Signed)
[facility] EMERGENCY DEPARTMENT Provider Note   CSN: [id number] Arrival date & time: 06/08/19  1652    History   Chief Complaint Chief Complaint  Patient presents with  . Seizures    HPI Jeremiah Alvarez is a 28 y.o. male.     HPI   Level 5 caveat due to decreased level of consciousness.  Jeremiah Alvarez is a 28 y.o. male, with a history of polysubstance abuse and seizures, presenting to the ED with decreased level consciousness.  EMS reports they were called for unresponsive at the jail.  Patient was found slumped against a wall.  He would intermittently open his eyes to painful stimuli, but had no other meaningful response.  The note no evidence of trauma. He reportedly has a history of seizures.  Deputy at the bedside states patient has been in jail for the last 5 days and has not received any medications nor are there medications ordered for him as far as he is aware.       Past Medical History:  Diagnosis Date  . Epilepsy [facility])     Patient Active Problem List   Diagnosis Date Noted  . Polysubstance (including opioids) dependence w/o physiol dependence ([place]) 05/18/2016  . Substance or medication-induced depressive disorder with onset during intoxication ([place]) 05/18/2016  . Cocaine abuse with cocaine-induced mood disorder ([place]) 05/18/2016    Past Surgical History:  Procedure Laterality Date  . HARDWARE REMOVAL  02/21/2012   Procedure: HARDWARE REMOVAL;  Surgeon: Schuyler Amor, MD;  Location: [place];  Service: Orthopedics;  Laterality: Left;  . ORIF FINGER FRACTURE  09/23/2011   Procedure: OPEN REDUCTION INTERNAL FIXATION (ORIF) METACARPAL (FINGER) FRACTURE;  Surgeon: Schuyler Amor, MD;  Location: [place];  Service: Orthopedics;  Laterality: Left;  orif left ring and small metacarpal fractures        Home Medications    Prior to Admission medications   Medication Sig Start Date End Date Taking? Authorizing  Provider  acetaminophen (TYLENOL) 500 MG tablet Take 500-1,000 mg by mouth every 6 (six) hours as needed (for pain.).    [provider]  ibuprofen (ADVIL,MOTRIN) 200 MG tablet Take 800 mg by mouth every 6 (six) hours as needed (for pain.).    [provider]  levETIRAcetam (KEPPRA) 500 MG tablet Take 1 tablet (500 mg total) by mouth 2 (two) times daily. 06/08/19 09/06/19  Joy, Shawn C, PA-C  naproxen (NAPROSYN) 500 MG tablet Take 1 tablet (500 mg total) by mouth 2 (two) times daily. Patient not taking: Reported on 05/18/2016 01/04/16   Jeannett Senior, PA-C    Family History History reviewed. No pertinent family history.  Social History Social History   Tobacco Use  . Smoking status: Current Some Day Smoker    Packs/day: 0.50    Years: 10.00    Pack years: 5.00    Types: Cigarettes  . Smokeless tobacco: Current User  Substance Use Topics  . Alcohol use: Not Currently    Comment: OCCAIS  . Drug use: Yes    Types: Marijuana, Cocaine, IV, Methamphetamines     Allergies   Patient has no known allergies.   Review of Systems Review of Systems  Unable to perform ROS: Mental status change  Constitutional: Negative for fever.     Physical Exam Updated Vital Signs BP 110/79 (BP Location: Left Arm)   Pulse 66   Temp (!) 97.3 F (36.3 C) (Tympanic)   Resp (!) 24  SpO2 100%   Physical Exam Vitals signs and nursing note reviewed.  Constitutional:      General: He is not in acute distress.    Appearance: He is well-developed. He is not diaphoretic.  HENT:     Head: Normocephalic and atraumatic.     Mouth/Throat:     Mouth: Mucous membranes are moist.     Pharynx: Oropharynx is clear.     Comments: Difficulty opening the patient's mouth due to some resistance in the jaw, though patient's airway does not show signs of compromise and he seems to be handling oral secretions without difficulty. Eyes:     Conjunctiva/sclera: Conjunctivae normal.      Comments: Patient has what appears to be a fixed downward gaze bilaterally.  Pupils are approximately 4 mm and equal bilaterally.  He will occasionally have fluttering to the eyelids, but no other noted response.  Neck:     Musculoskeletal: Neck supple.  Cardiovascular:     Rate and Rhythm: Normal rate and regular rhythm.     Pulses: Normal pulses.          Radial pulses are 2+ on the right side and 2+ on the left side.       Posterior tibial pulses are 2+ on the right side and 2+ on the left side.     Heart sounds: Normal heart sounds.     Comments: Tactile temperature in the extremities appropriate and equal bilaterally. Pulmonary:     Effort: Pulmonary effort is normal. No respiratory distress.     Breath sounds: Normal breath sounds.  Abdominal:     Palpations: Abdomen is soft.     Tenderness: There is no abdominal tenderness. There is no guarding.  Genitourinary:    Comments: No noted incontinence Musculoskeletal:     Right lower leg: No edema.     Left lower leg: No edema.     Comments: Overall trauma exam performed without any abnormalities noted other than those mentioned.  Lymphadenopathy:     Cervical: No cervical adenopathy.  Skin:    General: Skin is warm and dry.  Neurological:     GCS: GCS eye subscore is 2. GCS verbal subscore is 1. GCS motor subscore is 4.     Comments: Patient occasionally has fluttering of the eyelids, but no other response is noted.  Psychiatric:        Mood and Affect: Mood and affect normal.        Speech: Speech normal.        Behavior: Behavior normal.      ED Treatments / Results  Labs (all labs ordered are listed, but only abnormal results are displayed) Labs Reviewed  ACETAMINOPHEN LEVEL - Abnormal; Notable for the following components:      Result Value   Acetaminophen (Tylenol), Serum <10 (*)    All other components within normal limits  AMMONIA - Abnormal; Notable for the following components:   Ammonia 40 (*)    All other  components within normal limits  CBC WITH DIFFERENTIAL/PLATELET  COMPREHENSIVE METABOLIC PANEL  MAGNESIUM  SALICYLATE LEVEL  ETHANOL  URINALYSIS, ROUTINE W REFLEX MICROSCOPIC  RAPID URINE DRUG SCREEN, HOSP PERFORMED  CBG MONITORING, ED    EKG EKG Interpretation  Date/Time:  Tuesday June 08 2019 17:00:23 EDT Ventricular Rate:  69 PR Interval:    QRS Duration: 87 QT Interval:  369 QTC Calculation: 396 R Axis:   95 Text Interpretation:  Sinus rhythm Borderline right axis deviation No  significant change since last tracing Confirmed by Linwood DibblesKnapp, Jon [phone]([id number]) on 06/08/2019 5:15:30 PM   Radiology Ct Head Wo Contrast  Result Date: 06/08/2019 CLINICAL DATA:  Found unconscious. EXAM: CT HEAD WITHOUT CONTRAST CT CERVICAL SPINE WITHOUT CONTRAST TECHNIQUE: Multidetector CT imaging of the head and cervical spine was performed following the standard protocol without intravenous contrast. Multiplanar CT image reconstructions of the cervical spine were also generated. COMPARISON:  None. FINDINGS: CT HEAD FINDINGS Brain: No evidence of acute infarction, hemorrhage, hydrocephalus, extra-axial collection or mass lesion/mass effect. Vascular: No hyperdense vessel or unexpected calcification. Skull: Normal. Negative for fracture or focal lesion. Sinuses/Orbits: No acute finding. Other: None. CT CERVICAL SPINE FINDINGS Alignment: Normal. Skull base and vertebrae: No acute fracture. No primary bone lesion or focal pathologic process. Soft tissues and spinal canal: No prevertebral fluid or swelling. No visible canal hematoma. Disc levels:  Normal. Upper chest: Negative. Other: None. IMPRESSION: 1. No acute intracranial abnormality. 2. No evidence of acute traumatic injury to the cervical spine. Electronically Signed   By: Ted Mcalpineobrinka  Dimitrova M.D.   On: 06/08/2019 18:41   Ct Cervical Spine Wo Contrast  Result Date: 06/08/2019 CLINICAL DATA:  Found unconscious. EXAM: CT HEAD WITHOUT CONTRAST CT CERVICAL SPINE WITHOUT  CONTRAST TECHNIQUE: Multidetector CT imaging of the head and cervical spine was performed following the standard protocol without intravenous contrast. Multiplanar CT image reconstructions of the cervical spine were also generated. COMPARISON:  None. FINDINGS: CT HEAD FINDINGS Brain: No evidence of acute infarction, hemorrhage, hydrocephalus, extra-axial collection or mass lesion/mass effect. Vascular: No hyperdense vessel or unexpected calcification. Skull: Normal. Negative for fracture or focal lesion. Sinuses/Orbits: No acute finding. Other: None. CT CERVICAL SPINE FINDINGS Alignment: Normal. Skull base and vertebrae: No acute fracture. No primary bone lesion or focal pathologic process. Soft tissues and spinal canal: No prevertebral fluid or swelling. No visible canal hematoma. Disc levels:  Normal. Upper chest: Negative. Other: None. IMPRESSION: 1. No acute intracranial abnormality. 2. No evidence of acute traumatic injury to the cervical spine. Electronically Signed   By: Ted Mcalpineobrinka  Dimitrova M.D.   On: 06/08/2019 18:41    Procedures Procedures (including critical care time)  Medications Ordered in ED Medications  iohexol (OMNIPAQUE) 300 MG/ML solution 100 mL (has no administration in time range)  naloxone [facility](NARCAN) injection 0.4 mg (0.4 mg Intravenous Given 06/08/19 1706)  levETIRAcetam (KEPPRA) IVPB 1000 mg/100 mL premix (0 mg Intravenous Stopped 06/08/19 1813)     Initial Impression / Assessment and Plan / ED Course  I have reviewed the triage vital signs and the nursing notes.  Pertinent labs & imaging results that were available during my care of the patient were reviewed by me and considered in my medical decision making (see chart for details).  Clinical Course as of Jun 07 1949  Tue Jun 08, 2019  [id number] Nurse informs me patient is now awake, asking to have his c-collar removed.   [SJ]  1825 Spoke with patient.  He is alert and oriented x4. States he thinks he may have had a seizure.  No  incontinence or intraoral trauma noted.  He has had seizures with prolonged postictal episodes lasting hours previously. Last seizure was about a month ago.  States he thinks he was previously on Keppra, but it has been at least a year since he has taken it. History of drug abuse including cocaine, methamphetamine, and heroin.  Last use was heroin on July 12 with patient following that he is "done with that stuff."  Last alcohol use 4-1/2 years ago. He states he has some minor pain to the right side of the neck and right trapezius.   [SJ]  [id number] Spoke with Dr. Amada JupiterKirkpatrick, neurologist. Agrees this may have been a seizure with prolonged postictal period. Recommends patient be started back on Keppra 500 mg BID.   [SJ]    Clinical Course User Index [SJ] Joy, Shawn C, PA-C       Patient presents with altered mental status.  Due to the patient's reported history of seizure, there was suspicion for a prolonged postictal state.  Patient became completely alert and oriented during his ED course without recurrence of his previous state. Patient states he will be in jail for quite some time.  The prison guard at the bedside states patient should be able to be set up for outpatient follow-up with neurology.  He was restarted on Keppra. Return precautions discussed.  Patient voices understanding of these instructions, accepts the plan, and is comfortable with discharge.  Findings and plan of care discussed with Linwood DibblesJon Knapp, MD. Dr. Lynelle DoctorKnapp personally evaluated and examined this patient.   Vitals:   06/08/19 1715 06/08/19 1730 06/08/19 1745 06/08/19 1927  BP: 105/78 108/81 105/79 101/72  Pulse: 65 83 64 75  Resp: (!) 24 15 (!) 24 (!) 21  Temp:      TempSrc:      SpO2: 100% 100% 99% 99%  Weight:      Height:         Final Clinical Impressions(s) / ED Diagnoses   Final diagnoses:  Altered mental status, unspecified altered mental status type    ED Discharge Orders         Ordered     levETIRAcetam (KEPPRA) 500 MG tablet  2 times daily     06/08/19 1948           Concepcion LivingJoy, Shawn C, PA-C 06/08/19 1954    Linwood DibblesKnapp, Jon, MD 06/09/19 1044

## 2019-06-08 NOTE — ED Notes (Signed)
Pt tore c-collor off. Pt states it hurts and doesn't want to keep it on

## 2019-06-09 ENCOUNTER — Emergency Department (HOSPITAL_COMMUNITY)
Admission: EM | Admit: 2019-06-09 | Discharge: 2019-06-09 | Attending: Emergency Medicine | Admitting: Emergency Medicine

## 2019-06-09 ENCOUNTER — Emergency Department (HOSPITAL_COMMUNITY)

## 2019-06-09 ENCOUNTER — Other Ambulatory Visit: Payer: Self-pay

## 2019-06-09 DIAGNOSIS — R569 Unspecified convulsions: Secondary | ICD-10-CM | POA: Insufficient documentation

## 2019-06-09 DIAGNOSIS — F17210 Nicotine dependence, cigarettes, uncomplicated: Secondary | ICD-10-CM | POA: Insufficient documentation

## 2019-06-09 LAB — BASIC METABOLIC PANEL
Anion gap: 9 (ref 5–15)
BUN: 8 mg/dL (ref 6–20)
CO2: 25 mmol/L (ref 22–32)
Calcium: 8.9 mg/dL (ref 8.9–10.3)
Chloride: 106 mmol/L (ref 98–111)
Creatinine, Ser: 0.69 mg/dL (ref 0.61–1.24)
GFR calc Af Amer: >60 mL/min (ref >60)
GFR calc non Af Amer: >60 mL/min (ref >60)
Glucose, Bld: 94 mg/dL (ref 70–99)
Potassium: 4.2 mmol/L (ref 3.5–5.1)
Sodium: 140 mmol/L (ref 135–145)

## 2019-06-09 LAB — CBC
HCT: 42.4 % (ref 39.0–52.0)
Hemoglobin: 14.6 g/dL (ref 13.0–17.0)
MCH: 30 pg (ref 26.0–34.0)
MCHC: 34.4 g/dL (ref 30.0–36.0)
MCV: 87.2 fL (ref 80.0–100.0)
Platelets: 299 10*3/uL (ref 150–400)
RBC: 4.86 MIL/uL (ref 4.22–5.81)
RDW: 12.9 % (ref 11.5–15.5)
WBC: 6.9 10*3/uL (ref 4.0–10.5)
nRBC: 0 % (ref 0.0–0.2)

## 2019-06-09 LAB — CBG MONITORING, ED: Glucose-Capillary: 76 mg/dL (ref 70–99)

## 2019-06-09 MED ORDER — LEVETIRACETAM 500 MG PO TABS
1000.0000 mg | ORAL_TABLET | Freq: Once | ORAL | Status: AC
  Administered 2019-06-09: 17:00:00 1000 mg via ORAL
  Filled 2019-06-09: qty 2

## 2019-06-09 MED ORDER — LORAZEPAM 2 MG/ML IJ SOLN
INTRAMUSCULAR | Status: AC
  Filled 2019-06-09: qty 1

## 2019-06-09 MED ORDER — AMMONIA AROMATIC IN INHA
0.3000 mL | Freq: Once | RESPIRATORY_TRACT | Status: DC
  Filled 2019-06-09: qty 10

## 2019-06-09 MED ORDER — LEVETIRACETAM IN NACL 1000 MG/100ML IV SOLN: 1000.0000 mg | Freq: Once | INTRAVENOUS | Status: DC

## 2019-06-09 NOTE — ED Provider Notes (Addendum)
[facility] EMERGENCY DEPARTMENT Provider Note   CSN: [id number] Arrival date & time: 06/09/19  1558     History   Chief Complaint Chief Complaint  Patient presents with   Seizures    HPI JEVANTE HOLLIBAUGH is a 28 y.o. male who presents with a seizure. PMH significant for epilepsy, drug abuse. Initial History is obtained via EMS and RN. He had an unwitnessed seizure in jail this morning and was post-ictal for about 20 minutes. He then had another unwitnessed seizure in his cell this afternoon and therefore EMS was called. Of note, the seizures happened after the patient was denied an hour outside. Apparently he has been in jail since July 30th. He was here yesterday for the same and had labs, head CT, drug screen which were all normal. Neurology was consulted due to prolonged post-ictal phase and he was restarted on Keppra. He took Keppra this morning.   Once pt has woken up a little he states that he was told in the past that there is a "shadow" on his brain and he is very worried about this. He states this test was done about a year ago at [facility] which I can find no record of  LEVEL 5 caveat due to being non-verbal     HPI  Past Medical History:  Diagnosis Date   Epilepsy [facility])     Patient Active Problem List   Diagnosis Date Noted   Polysubstance (including opioids) dependence w/o physiol dependence ([place]) 05/18/2016   Substance or medication-induced depressive disorder with onset during intoxication ([place]) 05/18/2016   Cocaine abuse with cocaine-induced mood disorder ([place]) 05/18/2016    Past Surgical History:  Procedure Laterality Date   HARDWARE REMOVAL  02/21/2012   Procedure: HARDWARE REMOVAL;  Surgeon: Schuyler Amor, MD;  Location: [place];  Service: Orthopedics;  Laterality: Left;   ORIF FINGER FRACTURE  09/23/2011   Procedure: OPEN REDUCTION INTERNAL FIXATION (ORIF) METACARPAL (FINGER) FRACTURE;  Surgeon: Schuyler Amor, MD;  Location: [place];  Service: Orthopedics;  Laterality: Left;  orif left ring and small metacarpal fractures        Home Medications    Prior to Admission medications   Medication Sig Start Date End Date Taking? Authorizing Provider  acetaminophen (TYLENOL) 500 MG tablet Take 500-1,000 mg by mouth every 6 (six) hours as needed (for pain.).    [provider]  ibuprofen (ADVIL,MOTRIN) 200 MG tablet Take 800 mg by mouth every 6 (six) hours as needed (for pain.).    [provider]  levETIRAcetam (KEPPRA) 500 MG tablet Take 1 tablet (500 mg total) by mouth 2 (two) times daily. 06/08/19 09/06/19  Joy, Shawn C, PA-C  naproxen (NAPROSYN) 500 MG tablet Take 1 tablet (500 mg total) by mouth 2 (two) times daily. Patient not taking: Reported on 05/18/2016 01/04/16   Jeannett Senior, PA-C    Family History No family history on file.  Social History Social History   Tobacco Use   Smoking status: Current Some Day Smoker    Packs/day: 0.50    Years: 10.00    Pack years: 5.00    Types: Cigarettes   Smokeless tobacco: Current User  Substance Use Topics   Alcohol use: Not Currently    Comment: OCCAIS   Drug use: Yes    Types: Marijuana, Cocaine, IV, Methamphetamines     Allergies   Patient has no known allergies.   Review of Systems Review of Systems  Unable  to perform ROS: Patient nonverbal     Physical Exam Updated Vital Signs BP 104/81 (BP Location: Right Arm)    Pulse 74    Temp 98.5 F (36.9 C) (Oral)    Resp 14    Ht 5\' 7"  (1.702 m)    Wt 65.8 kg    SpO2 98%    BMI 22.71 kg/m   Physical Exam Vitals signs and nursing note reviewed.  Constitutional:      General: He is not in acute distress.    Appearance: Normal appearance. He is well-developed. He is not ill-appearing.  HENT:     Head: Normocephalic and atraumatic.  Eyes:     General: No scleral icterus.       Right eye: No discharge.        Left eye: No discharge.      Conjunctiva/sclera: Conjunctivae normal.     Pupils: Pupils are equal, round, and reactive to light.  Neck:     Musculoskeletal: Normal range of motion.  Cardiovascular:     Rate and Rhythm: Normal rate.  Pulmonary:     Effort: Pulmonary effort is normal. No respiratory distress.  Abdominal:     General: There is no distension.  Skin:    General: Skin is warm and dry.  Neurological:     Mental Status: He is alert and oriented to person, place, and time.     Comments: Pt blinks to threat but is unresponsive to verbal or painful stimuli  Psychiatric:        Behavior: Behavior normal.      ED Treatments / Results  Labs (all labs ordered are listed, but only abnormal results are displayed) Labs Reviewed  CBC  BASIC METABOLIC PANEL  CBG MONITORING, ED    EKG None  Radiology Ct Head Wo Contrast  Result Date: 06/09/2019 CLINICAL DATA:  Altered level of consciousness.  Possible seizure EXAM: CT HEAD WITHOUT CONTRAST TECHNIQUE: Contiguous axial images were obtained from the base of the skull through the vertex without intravenous contrast. COMPARISON:  CT head 06/08/2019 FINDINGS: Brain: No evidence of acute infarction, hemorrhage, hydrocephalus, extra-axial collection or mass lesion/mass effect. Vascular: Negative for hyperdense vessel Skull: Negative Sinuses/Orbits: Mild mucosal edema paranasal sinuses.  Normal orbit. Other: None IMPRESSION: Negative CT head Electronically Signed   By: Marlan Palauharles  Clark M.D.   On: 06/09/2019 19:02   Ct Head Wo Contrast  Result Date: 06/08/2019 CLINICAL DATA:  Found unconscious. EXAM: CT HEAD WITHOUT CONTRAST CT CERVICAL SPINE WITHOUT CONTRAST TECHNIQUE: Multidetector CT imaging of the head and cervical spine was performed following the standard protocol without intravenous contrast. Multiplanar CT image reconstructions of the cervical spine were also generated. COMPARISON:  None. FINDINGS: CT HEAD FINDINGS Brain: No evidence of acute infarction,  hemorrhage, hydrocephalus, extra-axial collection or mass lesion/mass effect. Vascular: No hyperdense vessel or unexpected calcification. Skull: Normal. Negative for fracture or focal lesion. Sinuses/Orbits: No acute finding. Other: None. CT CERVICAL SPINE FINDINGS Alignment: Normal. Skull base and vertebrae: No acute fracture. No primary bone lesion or focal pathologic process. Soft tissues and spinal canal: No prevertebral fluid or swelling. No visible canal hematoma. Disc levels:  Normal. Upper chest: Negative. Other: None. IMPRESSION: 1. No acute intracranial abnormality. 2. No evidence of acute traumatic injury to the cervical spine. Electronically Signed   By: Ted Mcalpineobrinka  Dimitrova M.D.   On: 06/08/2019 18:41   Ct Cervical Spine Wo Contrast  Result Date: 06/08/2019 CLINICAL DATA:  Found unconscious. EXAM: CT HEAD WITHOUT CONTRAST  CT CERVICAL SPINE WITHOUT CONTRAST TECHNIQUE: Multidetector CT imaging of the head and cervical spine was performed following the standard protocol without intravenous contrast. Multiplanar CT image reconstructions of the cervical spine were also generated. COMPARISON:  None. FINDINGS: CT HEAD FINDINGS Brain: No evidence of acute infarction, hemorrhage, hydrocephalus, extra-axial collection or mass lesion/mass effect. Vascular: No hyperdense vessel or unexpected calcification. Skull: Normal. Negative for fracture or focal lesion. Sinuses/Orbits: No acute finding. Other: None. CT CERVICAL SPINE FINDINGS Alignment: Normal. Skull base and vertebrae: No acute fracture. No primary bone lesion or focal pathologic process. Soft tissues and spinal canal: No prevertebral fluid or swelling. No visible canal hematoma. Disc levels:  Normal. Upper chest: Negative. Other: None. IMPRESSION: 1. No acute intracranial abnormality. 2. No evidence of acute traumatic injury to the cervical spine. Electronically Signed   By: Ted Mcalpineobrinka  Dimitrova M.D.   On: 06/08/2019 18:41    Procedures Procedures  (including critical care time)  Medications Ordered in ED Medications  ammonia inhalant 0.3 mL (has no administration in time range)  levETIRAcetam (KEPPRA) tablet 1,000 mg (1,000 mg Oral Given 06/09/19 1720)     Initial Impression / Assessment and Plan / ED Course  I have reviewed the triage vital signs and the nursing notes.  Pertinent labs & imaging results that were available during my care of the patient were reviewed by me and considered in my medical decision making (see chart for details).  28 year old male presents with 2 unwittnessed seizures in jail. Has hx of epilepsy. Initially he is alert, but non-verbal and not responding to physical stimuli. He is unable to participate in a neuro exam. On review of EMR yesterday, his presentation was identical. After some time, the patient returned back to baseline. Labs and head CT were repeated and are normal. I re-examined him and he is alert, talking. He was given a dose of Keppra here. He is very worried that he is having more seizures and wants to see a neurologist. [facility]  Final Clinical Impressions(s) / ED Diagnoses   Final diagnoses:  Seizure [facility](HCC)    ED Discharge Orders    None         Bethel BornGekas, Ladiamond Gallina Marie, PA-C 06/09/19 1946    Virgina Norfolkuratolo, Adam, DO 06/10/19 2200

## 2019-06-09 NOTE — ED Triage Notes (Signed)
Pt arrived from [facility] by [employer]. Pt had an unwitnessed seizure yesterday and today. Pt has been unresponsive to verbal stimuli today. Per EMS pt's vs wnl. Pt did look at EMS pupils 39mm and equal, brisk, will blink. Pt NSR on monitor. VS WNL.

## 2019-06-09 NOTE — Discharge Instructions (Signed)
Please set up an appointment to see neurology as an outpatient

## 2019-06-09 NOTE — ED Provider Notes (Signed)
Medical screening examination/treatment/procedure(s) were conducted as a shared visit with non-physician practitioner(s) and myself.  I personally evaluated the patient during the encounter. Briefly, the patient is a 28 y.o. male with history of polysubstance abuse, seizure history who presents the ED with possible seizure-like activity.  Patient with normal vitals.  No fever.  Patient with episodes of seizures yesterday.  Was started on Keppra after having unremarkable work-up.  Patient is awake but does not talk.  Unsure if he is in a postictal state or not participating with exam.  He appears to move extremities.  We will get basic labs, CT head again is unknown sure if he had any trauma.  Will see if patient has returned to baseline.  Not sure if these are true seizures or pseudoseizures.  May need to discuss with neurology again.  Following my examination patient did have a quick return to his baseline.  Was complained of pain to the right side of his head.  Unsure if he hit it.  Took his Keppra this morning.  Neurologically he is intact.  Will get a CT scan of his head and monitor further.  At this point believe patient would be okay for discharge as he is at his baseline.  Just started Keppra yesterday.  Recommend that he continue his Keppra dose.  This chart was dictated using voice recognition software.  Despite best efforts to proofread,  errors can occur which can change the documentation meaning.    EKG Interpretation None          Lennice Sites, DO 06/09/19 1813

## 2019-07-27 ENCOUNTER — Other Ambulatory Visit: Payer: Self-pay

## 2019-07-27 DIAGNOSIS — F1110 Opioid abuse, uncomplicated: Secondary | ICD-10-CM | POA: Insufficient documentation

## 2019-07-27 DIAGNOSIS — Z9114 Patient's other noncompliance with medication regimen: Secondary | ICD-10-CM | POA: Insufficient documentation

## 2019-07-27 DIAGNOSIS — F1210 Cannabis abuse, uncomplicated: Secondary | ICD-10-CM | POA: Insufficient documentation

## 2019-07-27 DIAGNOSIS — F1410 Cocaine abuse, uncomplicated: Secondary | ICD-10-CM | POA: Insufficient documentation

## 2019-07-27 DIAGNOSIS — F17210 Nicotine dependence, cigarettes, uncomplicated: Secondary | ICD-10-CM | POA: Insufficient documentation

## 2019-07-27 DIAGNOSIS — G40909 Epilepsy, unspecified, not intractable, without status epilepticus: Secondary | ICD-10-CM | POA: Insufficient documentation

## 2019-07-27 LAB — CBC
HCT: 40.4 % (ref 39.0–52.0)
Hemoglobin: 14.1 g/dL (ref 13.0–17.0)
MCH: 30.1 pg (ref 26.0–34.0)
MCHC: 34.9 g/dL (ref 30.0–36.0)
MCV: 86.3 fL (ref 80.0–100.0)
Platelets: 280 10*3/uL (ref 150–400)
RBC: 4.68 MIL/uL (ref 4.22–5.81)
RDW: 13 % (ref 11.5–15.5)
WBC: 8.9 10*3/uL (ref 4.0–10.5)
nRBC: 0 % (ref 0.0–0.2)

## 2019-07-27 LAB — COMPREHENSIVE METABOLIC PANEL
ALT: 51 U/L — ABNORMAL HIGH (ref 0–44)
AST: 38 U/L (ref 15–41)
Albumin: 4.2 g/dL (ref 3.5–5.0)
Alkaline Phosphatase: 43 U/L (ref 38–126)
Anion gap: 11 (ref 5–15)
BUN: 16 mg/dL (ref 6–20)
CO2: 28 mmol/L (ref 22–32)
Calcium: 9.6 mg/dL (ref 8.9–10.3)
Chloride: 99 mmol/L (ref 98–111)
Creatinine, Ser: 0.83 mg/dL (ref 0.61–1.24)
GFR calc Af Amer: >60 mL/min (ref >60)
GFR calc non Af Amer: >60 mL/min (ref >60)
Glucose, Bld: 88 mg/dL (ref 70–99)
Potassium: 4.3 mmol/L (ref 3.5–5.1)
Sodium: 138 mmol/L (ref 135–145)
Total Bilirubin: 1 mg/dL (ref 0.3–1.2)
Total Protein: 7.6 g/dL (ref 6.5–8.1)

## 2019-07-27 NOTE — ED Notes (Signed)
Lab results reviewed. Awaiting room for MD eval.  

## 2019-07-27 NOTE — ED Notes (Signed)
Verbal orders for lab per Corky Downs MD

## 2019-07-27 NOTE — ED Notes (Signed)
Patient updated on wait time. Patient in no acute distress at this time.

## 2019-07-27 NOTE — ED Triage Notes (Signed)
Pt comes via POV from home with c/o seizures. Pt states about 4 seizures today. Pt also states he takes Keppra for it, but is out of it. Pt states he has been out for about 2 weeks.  Pt states he feel and hit his head today. Pt states he is worried about his postical state. Pt states he has not been back to his normal state.  Pt also states hx of stroke. Pt rambling and unable to fully understand what he is saying.

## 2019-07-28 ENCOUNTER — Emergency Department
Admission: EM | Admit: 2019-07-28 | Discharge: 2019-07-28 | Disposition: A | Discharge status: Home / Self Care | Attending: Emergency Medicine | Admitting: Emergency Medicine

## 2019-07-28 ENCOUNTER — Emergency Department

## 2019-07-28 DIAGNOSIS — F1910 Other psychoactive substance abuse, uncomplicated: Secondary | ICD-10-CM

## 2019-07-28 DIAGNOSIS — R569 Unspecified convulsions: Secondary | ICD-10-CM

## 2019-07-28 MED ORDER — LEVETIRACETAM 500 MG PO TABS: 500.0000 mg | ORAL_TABLET | Freq: Two times a day (BID) | ORAL | 1 refills | Status: AC

## 2019-07-28 MED ORDER — LEVETIRACETAM IN NACL 1000 MG/100ML IV SOLN
1000.0000 mg | Freq: Once | INTRAVENOUS | Status: AC
  Administered 2019-07-28: 1000 mg via INTRAVENOUS
  Filled 2019-07-28: qty 100

## 2019-07-28 NOTE — ED Notes (Signed)
Pt out of seizure meds for 2-3 weeks. States he had 3 seizures today.  Pt states he is also iv drug user.  No etoh use.   Denies si or hi.  Pt alert   Speech clear  Friend with pt.

## 2019-07-28 NOTE — ED Notes (Signed)
Patient transported to CT 

## 2019-07-28 NOTE — ED Notes (Signed)
Patient visualized in lobby in no acute distress. Awaiting room for MD eval.

## 2019-07-28 NOTE — ED Provider Notes (Signed)
[facility] Emergency Department Provider Note    First MD Initiated Contact with Patient 07/28/19 0122     (approximate)  I have reviewed the triage vital signs and the nursing notes.   HISTORY  Chief Complaint Seizures    HPI Jeremiah Alvarez is a 28 y.o. male with history of epilepsy and polysubstance abuse presents to the emergency department following 3 witnessed seizures today by his girlfriend at bedside.  Patient's girlfriend states that he has had "mood swings lately".  She states that he had 3 episodes today where he was shaking consistent with previous seizures.  Patient states that he has not taken any Keppra in approximately 2 weeks.  Patient does admit to heroin use today.        Past Medical History:  Diagnosis Date  . Epilepsy [facility])     Patient Active Problem List   Diagnosis Date Noted  . Polysubstance (including opioids) dependence w/o physiol dependence ([place]) 05/18/2016  . Substance or medication-induced depressive disorder with onset during intoxication ([place]) 05/18/2016  . Cocaine abuse with cocaine-induced mood disorder ([place]) 05/18/2016    Past Surgical History:  Procedure Laterality Date  . HARDWARE REMOVAL  02/21/2012   Procedure: HARDWARE REMOVAL;  Surgeon: Schuyler Amor, MD;  Location: [place];  Service: Orthopedics;  Laterality: Left;  . ORIF FINGER FRACTURE  09/23/2011   Procedure: OPEN REDUCTION INTERNAL FIXATION (ORIF) METACARPAL (FINGER) FRACTURE;  Surgeon: Schuyler Amor, MD;  Location: [place];  Service: Orthopedics;  Laterality: Left;  orif left ring and small metacarpal fractures    Prior to Admission medications   Medication Sig Start Date End Date Taking? Authorizing Provider  levETIRAcetam (KEPPRA) 500 MG tablet Take 1 tablet (500 mg total) by mouth 2 (two) times daily. 06/08/19 09/06/19  Joy, Shawn C, PA-C  naproxen (NAPROSYN) 500 MG tablet Take 1 tablet (500 mg total) by mouth 2 (two)  times daily. Patient not taking: Reported on 05/18/2016 01/04/16   Jeannett Senior, PA-C    Allergies Patient has no known allergies.  No family history on file.  Social History Social History   Tobacco Use  . Smoking status: Current Some Day Smoker    Packs/day: 0.50    Years: 10.00    Pack years: 5.00    Types: Cigarettes  . Smokeless tobacco: Current User  Substance Use Topics  . Alcohol use: Not Currently    Comment: OCCAIS  . Drug use: Yes    Types: Marijuana, Cocaine, IV, Methamphetamines    Review of Systems Constitutional: No fever/chills Eyes: No visual changes. ENT: No sore throat. Cardiovascular: Denies chest pain. Respiratory: Denies shortness of breath. Gastrointestinal: No abdominal pain.  No nausea, no vomiting.  No diarrhea.  No constipation. Genitourinary: Negative for dysuria. Musculoskeletal: Negative for neck pain.  Negative for back pain. Integumentary: Negative for rash. Neurological: Negative for headaches, focal weakness or numbness.  Positive for witnessed seizure activity  ____________________________________________   PHYSICAL EXAM:  VITAL SIGNS: ED Triage Vitals  Enc Vitals Group     BP 07/27/19 2219 (!) 124/99     Pulse Rate 07/27/19 2219 98     Resp 07/27/19 2219 18     Temp 07/27/19 2219 98.5 F (36.9 C)     Temp src --      SpO2 07/27/19 2219 100 %     Weight 07/27/19 2220 61.2 kg (135 lb)     Height 07/27/19 2220 1.702 m (5\' 7" )  Head Circumference --      Peak Flow --      Pain Score 07/27/19 2220 4     Pain Loc --      Pain Edu? --      Excl. in GC? --     Constitutional: Alert and oriented.   Eyes: Conjunctivae are normal.  Head: Atraumatic. Ears:  Healthy appearing ear canals and TMs bilaterally Nose: No congestion/rhinnorhea. Mouth/Throat: Mucous membranes are moist. Neck: No stridor.  No meningeal signs.   Cardiovascular: Normal rate, regular rhythm. Good peripheral circulation. Grossly normal heart  sounds. Respiratory: Normal respiratory effort.  No retractions. Gastrointestinal: Soft and nontender. No distention.  Musculoskeletal: No lower extremity tenderness nor edema. No gross deformities of extremities. Neurologic:  Normal speech and language. No gross focal neurologic deficits are appreciated.  Skin:  Skin is warm, dry and intact. Psychiatric: Mood and affect are normal. Speech and behavior are normal.  ____________________________________________   LABS (all labs ordered are listed, but only abnormal results are displayed)  Labs Reviewed  COMPREHENSIVE METABOLIC PANEL - Abnormal; Notable for the following components:      Result Value   ALT 51 (*)    All other components within normal limits  CBC    RADIOLOGY I, [facility], personally viewed and evaluated these images (plain radiographs) as part of my medical decision making, as well as reviewing the written report by the radiologist.  ED MD interpretation: CT head unremarkable per radiologist.  Official radiology report(s): Ct Head Wo Contrast  Result Date: 07/28/2019 CLINICAL DATA:  Head injury. Seizure after fall. Seizure history. EXAM: CT HEAD WITHOUT CONTRAST TECHNIQUE: Contiguous axial images were obtained from the base of the skull through the vertex without intravenous contrast. COMPARISON:  Head CT 06/09/2019 FINDINGS: Brain: No evidence of acute infarction, hemorrhage, hydrocephalus, extra-axial collection or mass lesion/mass effect. Vascular: No hyperdense vessel or unexpected calcification. Skull: Normal. Negative for fracture or focal lesion. Sinuses/Orbits: Paranasal sinuses and mastoid air cells are clear. The visualized orbits are unremarkable. Other: None. IMPRESSION: Unremarkable noncontrast head CT. Electronically Signed   By: Narda Rutherford M.D.   On: 07/28/2019 01:00    ___________________________________  Procedures   ____________________________________________   INITIAL IMPRESSION /  MDM / ASSESSMENT AND PLAN / ED COURSE  As part of my medical decision making, I reviewed the following data within the electronic MEDICAL RECORD NUMBER   28 year old male presented with above-stated history and physical exam secondary to seizure disorder in the setting of medication noncompliance.  Patient given Keppra 1 g IV.  Patient will be prescribed Keppra for home.  I offered detox to both the patient and his significant other however they refused.  ____________________________________________  FINAL CLINICAL IMPRESSION(S) / ED DIAGNOSES  Final diagnoses:  Seizure (HCC)  Polysubstance abuse (HCC)     MEDICATIONS GIVEN DURING THIS VISIT:  Medications  levETIRAcetam (KEPPRA) IVPB 1000 mg/100 mL premix (1,000 mg Intravenous New Bag/Given 07/28/19 0131)     ED Discharge Orders    None      *Please note:  Wilmon L Joa was evaluated in Emergency Department on 07/28/2019 for the symptoms described in the history of present illness. He was evaluated in the context of the global COVID-19 pandemic, which necessitated consideration that the patient might be at risk for infection with the SARS-CoV-2 virus that causes COVID-19. Institutional protocols and algorithms that pertain to the evaluation of patients at risk for COVID-19 are in a state of rapid change based  on information released by regulatory bodies including the CDC and federal and state organizations. These policies and algorithms were followed during the patient's care in the ED.  Some ED evaluations and interventions may be delayed as a result of limited staffing during the pandemic.*  Note:  This document was prepared using Dragon voice recognition software and may include unintentional dictation errors.   Darci Current, MD 07/28/19 Emeline Darling

## 2022-01-14 ENCOUNTER — Emergency Department (HOSPITAL_COMMUNITY): Payer: 59

## 2022-01-14 ENCOUNTER — Other Ambulatory Visit: Payer: Self-pay

## 2022-01-14 ENCOUNTER — Emergency Department (HOSPITAL_COMMUNITY)
Admission: EM | Admit: 2022-01-14 | Discharge: 2022-01-14 | Disposition: A | Discharge status: Home / Self Care | Payer: 59 | Attending: Emergency Medicine | Admitting: Emergency Medicine

## 2022-01-14 ENCOUNTER — Encounter (HOSPITAL_COMMUNITY): Payer: Self-pay | Admitting: *Deleted

## 2022-01-14 DIAGNOSIS — W228XXA Striking against or struck by other objects, initial encounter: Secondary | ICD-10-CM | POA: Diagnosis not present

## 2022-01-14 DIAGNOSIS — S62352A Nondisplaced fracture of shaft of third metacarpal bone, right hand, initial encounter for closed fracture: Secondary | ICD-10-CM | POA: Insufficient documentation

## 2022-01-14 DIAGNOSIS — S62342A Nondisplaced fracture of base of third metacarpal bone, right hand, initial encounter for closed fracture: Secondary | ICD-10-CM

## 2022-01-14 DIAGNOSIS — S6991XA Unspecified injury of right wrist, hand and finger(s), initial encounter: Secondary | ICD-10-CM | POA: Diagnosis present

## 2022-01-14 MED ORDER — NAPROXEN 500 MG PO TABS: 500.0000 mg | ORAL_TABLET | Freq: Two times a day (BID) | ORAL | 0 refills | Status: AC

## 2022-01-14 MED ORDER — OXYCODONE-ACETAMINOPHEN 5-325 MG PO TABS: 1.0000 | ORAL_TABLET | Freq: Three times a day (TID) | ORAL | 0 refills | Status: AC | PRN

## 2022-01-14 MED ORDER — OXYCODONE-ACETAMINOPHEN 5-325 MG PO TABS
1.0000 | ORAL_TABLET | Freq: Once | ORAL | Status: DC
  Filled 2022-01-14: qty 1

## 2022-01-14 MED ORDER — ACETAMINOPHEN 500 MG PO TABS
1000.0000 mg | ORAL_TABLET | Freq: Once | ORAL | Status: DC
  Filled 2022-01-14: qty 2

## 2022-01-14 NOTE — Progress Notes (Signed)
Orthopedic Tech Progress Note ?Patient Details:  ?Jeremiah Alvarez ?[birth year] ?[id number] ? ?Ortho Devices ?Type of Ortho Device: Ace wrap, Velcro wrist splint ?Ortho Device/Splint Interventions: Application ?  ?Post Interventions ?Patient Tolerated: Well ?Instructions Provided: Care of device ? ?Saul Fordyce ?01/14/2022, 2:37 PM ? ?

## 2022-01-14 NOTE — ED Provider Notes (Cosign Needed)
[facility]-EMERGENCY DEPT Provider Note   CSN: [id number] Arrival date & time: 01/14/22  1326     History  Chief Complaint  Patient presents with   Hand Injury    STONY STEGMANN is a 31 y.o. male with past medical history of seizures and heroin use.  Presents to the emergency department with a chief complaint of right hand injury.  Patient states that 2 days prior he was changing a tire on a car when the jack fell out.  Patient states that a piece of the running board hit his hand into a brick.  Patient has had pain and swelling to dorsum of right hand as well as digits of right hand since then.  Patient rates his pain 8/10 on the pain scale.  Pain is worse with touch and movement.  Patient has been taking ibuprofen with minimal improvement in his pain.  Patient reports that swelling has gradually decreased over time.  Patient denies any numbness, weakness, color change, wound.    Patient is right-hand dominant.  States that he has had previous surgery to left hand performed by Dr. Mina Marble.   Hand Injury Associated symptoms: no fever       Home Medications Prior to Admission medications   Medication Sig Start Date End Date Taking? Authorizing Provider  levETIRAcetam (KEPPRA) 500 MG tablet Take 1 tablet (500 mg total) by mouth 2 (two) times daily. 07/28/19 09/26/19  Darci Current, MD  naproxen (NAPROSYN) 500 MG tablet Take 1 tablet (500 mg total) by mouth 2 (two) times daily. Patient not taking: Reported on 05/18/2016 01/04/16   Jaynie Crumble, PA-C      Allergies    Patient has no known allergies.    Review of Systems   Review of Systems  Constitutional:  Negative for chills and fever.  Musculoskeletal:  Positive for arthralgias, joint swelling and myalgias.  Skin:  Negative for color change, pallor, rash and wound.  Neurological:  Negative for weakness and numbness.   Physical Exam Updated Vital Signs BP 115/83 (BP Location: Left Arm)     Pulse 90    Temp 98.4 F (36.9 C) (Oral)    Resp 18    Ht 5\' 7"  (1.702 m)    Wt 70.3 kg    SpO2 96%    BMI 24.28 kg/m  Physical Exam Vitals and nursing note reviewed.  Constitutional:      General: He is not in acute distress.    Appearance: He is not ill-appearing, toxic-appearing or diaphoretic.  HENT:     Head: Normocephalic.  Eyes:     General: No scleral icterus.       Right eye: No discharge.        Left eye: No discharge.  Cardiovascular:     Rate and Rhythm: Normal rate.     Pulses:          Radial pulses are 2+ on the right side and 2+ on the left side.  Pulmonary:     Effort: Pulmonary effort is normal.  Musculoskeletal:     Right forearm: Normal.     Left forearm: Normal.     Right wrist: Normal.     Left wrist: Normal.     Right hand: Swelling, tenderness and bony tenderness present. No deformity or lacerations. Decreased range of motion. Normal sensation. Normal capillary refill.     Left hand: No swelling, deformity, lacerations, tenderness or bony tenderness. Normal range of motion. Normal sensation. Normal  capillary refill.     Comments: Patient is swelling to dorsum of right hand and all digits of right hand.  Decreased range of motion to all digits of right hand secondary to complaints of pain.  Sensation intact to all aspects of all fingers of right hand.  Cap refill less than 2 seconds in all digits of right hand.  Diffuse tenderness to dorsum of right hand.  Skin:    General: Skin is warm and dry.  Neurological:     General: No focal deficit present.     Mental Status: He is alert and oriented to person, place, and time.     GCS: GCS eye subscore is 4. GCS verbal subscore is 5. GCS motor subscore is 6.  Psychiatric:        Behavior: Behavior is cooperative.    ED Results / Procedures / Treatments   Labs (all labs ordered are listed, but only abnormal results are displayed) Labs Reviewed - No data to display  EKG None  Radiology DG Hand Complete  Right  Result Date: 01/14/2022 CLINICAL DATA:  Injury 2 days ago EXAM: RIGHT HAND - COMPLETE 3+ VIEW COMPARISON:  None. FINDINGS: There is a very subtle, nondisplaced fracture of the base of the right third metacarpal, seen on frontal and oblique views. There is no evidence of arthropathy or other focal bone abnormality. Soft tissue edema of the dorsum of the hand. IMPRESSION: There is a very subtle, nondisplaced fracture of the base of the right third metacarpal. No other fracture or dislocation identified. Electronically Signed   By: Jearld LeschAlex D Bibbey M.D.   On: 01/14/2022 14:10    Procedures Procedures    Medications Ordered in ED Medications - No data to display  ED Course/ Medical Decision Making/ A&P                           Medical Decision Making Amount and/or Complexity of Data Reviewed Radiology: ordered.  Risk OTC drugs. Prescription drug management.   Alert 31 year old male in no acute distress, nontoxic-appearing.  Presents to the ED with a chief complaint of right hand injury.  Information is obtained from patient.  Past medical records were reviewed including previous provider notes.  Patient has past medical history as noted in HPI which complicates his care.  Due to swelling and tenderness on exam concern for acute osseous abnormality.  Will obtain x-ray imaging to evaluate further.  X-ray imaging was personally viewed and interpreted by myself.  I agree with radiology interpretation of subtle nondisplaced fracture of the base of the right third metacarpal.  I spoke with on-call PA for hand specialist Earney HamburgMichael Jeffrey who advises to place patient in splint and have him follow-up in the outpatient setting.  As patient has previously seen Dr. Mina MarbleWeingold we will have him return to the specialist.  Patient has past medical history of heroin use.  States that he is currently not using any illicit drug use.  Has not used any opiates in 5 years.  Shared decision making with  patient on opiate pain medication versus Tylenol and naproxen.  Patient requesting opiate pain medication for breakthrough pain.  Will prescribe patient with short course of opiate medication due to acute fracture.  Additionally will prescribe naproxen.  Patient advised to use Tylenol and ibuprofen as first-line medications.  Discussed results, findings, treatment and follow up. Patient advised of return precautions. Patient verbalized understanding and agreed with plan.  Final Clinical Impression(s) / ED Diagnoses Final diagnoses:  None    Rx / DC Orders ED Discharge Orders     None         Haskel Schroeder, PA-C 01/14/22 1501

## 2022-01-14 NOTE — Discharge Instructions (Addendum)
You came to the emergency department today to be evaluated for your right hand injury.  Your physical exam shows that you have a subtle fracture at the base of your third metacarpal bone.  Due to this you were placed in a splint and will need to follow-up with Dr. Burney Gauze in the outpatient setting. ? ?Please take the naproxen as prescribed.  Additionally you may take Tylenol as outlined below. ? ?Please take Tylenol (acetaminophen) to relieve your pain.  You make take tylenol, up to 1,000 mg (two extra strength pills) every 8 hours as needed.  Do not take more than 3,000 mg tylenol in a 24 hour period (not more than one dose every 8 hours.  Please check all medication labels as many medications such as pain and cold medications may contain tylenol.  Do not drink alcohol while taking these medications. ? ?Get help right away if: ?You have severe pain. ?The following happens, even after you loosen your splint: ?Your hand or fingernails turn blue or gray. ?Your hand feels cold or numb. ?

## 2022-01-14 NOTE — ED Triage Notes (Signed)
Pt states car jack kicked out and hit rt hand, swelling to post hand and fingers, Event happened 2 days ago. ?

## 2023-06-16 ENCOUNTER — Other Ambulatory Visit: Payer: Self-pay

## 2023-06-16 ENCOUNTER — Emergency Department (HOSPITAL_COMMUNITY)
Admission: EM | Admit: 2023-06-16 | Discharge: 2023-06-16 | Discharge status: Against Medical Advice | Payer: Medicaid Other | Attending: Emergency Medicine | Admitting: Emergency Medicine

## 2023-06-16 DIAGNOSIS — Z5329 Procedure and treatment not carried out because of patient's decision for other reasons: Secondary | ICD-10-CM | POA: Diagnosis not present

## 2023-06-16 DIAGNOSIS — W5581XA Bitten by other mammals, initial encounter: Secondary | ICD-10-CM | POA: Diagnosis not present

## 2023-06-16 DIAGNOSIS — T63001A Toxic effect of unspecified snake venom, accidental (unintentional), initial encounter: Secondary | ICD-10-CM | POA: Insufficient documentation

## 2023-06-16 DIAGNOSIS — R69 Illness, unspecified: Secondary | ICD-10-CM | POA: Diagnosis not present

## 2023-06-16 DIAGNOSIS — W5911XA Bitten by nonvenomous snake, initial encounter: Secondary | ICD-10-CM

## 2023-06-16 LAB — BASIC METABOLIC PANEL
Anion gap: 11 (ref 5–15)
BUN: 17 mg/dL (ref 6–20)
CO2: 28 mmol/L (ref 22–32)
Calcium: 9.5 mg/dL (ref 8.9–10.3)
Chloride: 100 mmol/L (ref 98–111)
Creatinine, Ser: 1.02 mg/dL (ref 0.61–1.24)
GFR, Estimated: >60 mL/min (ref >60)
Glucose, Bld: 90 mg/dL (ref 70–99)
Potassium: 4.5 mmol/L (ref 3.5–5.1)
Sodium: 139 mmol/L (ref 135–145)

## 2023-06-16 LAB — CBC WITH DIFFERENTIAL/PLATELET
Abs Immature Granulocytes: 0.05 10*3/uL (ref 0.00–0.07)
Basophils Absolute: 0.1 10*3/uL (ref 0.0–0.1)
Basophils Relative: 1 %
Eosinophils Absolute: 0.1 10*3/uL (ref 0.0–0.5)
Eosinophils Relative: 2 %
HCT: 40.6 % (ref 39.0–52.0)
Hemoglobin: 14.3 g/dL (ref 13.0–17.0)
Immature Granulocytes: 1 %
Lymphocytes Relative: 22 %
Lymphs Abs: 1.7 10*3/uL (ref 0.7–4.0)
MCH: 30.8 pg (ref 26.0–34.0)
MCHC: 35.2 g/dL (ref 30.0–36.0)
MCV: 87.3 fL (ref 80.0–100.0)
Monocytes Absolute: 0.5 10*3/uL (ref 0.1–1.0)
Monocytes Relative: 6 %
Neutro Abs: 5.4 10*3/uL (ref 1.7–7.7)
Neutrophils Relative %: 68 %
Platelets: 259 10*3/uL (ref 150–400)
RBC: 4.65 MIL/uL (ref 4.22–5.81)
RDW: 13.2 % (ref 11.5–15.5)
WBC: 7.9 10*3/uL (ref 4.0–10.5)
nRBC: 0 % (ref 0.0–0.2)

## 2023-06-16 LAB — PROTIME-INR
INR: 1.1 (ref 0.8–1.2)
Prothrombin Time: 14.5 seconds (ref 11.4–15.2)

## 2023-06-16 LAB — FIBRINOGEN: Fibrinogen: 252 mg/dL (ref 210–475)

## 2023-06-16 NOTE — ED Notes (Signed)
Unable to find pt at this time. Staff checked and looked in the ED. Pt eloped. Will notify MD.

## 2023-06-16 NOTE — ED Triage Notes (Signed)
Patient bit by baby copperhead to R medial ankle just PTA.

## 2023-06-16 NOTE — ED Provider Notes (Signed)
[place] EMERGENCY DEPARTMENT AT [facility] Provider Note   CSN: [id number] Arrival date & time: 06/16/23  1625     History  No chief complaint on file.  HPI Jeremiah Alvarez is a 32 y.o. male presenting for snakebite.  States this occurred around 3:30 PM.  Patient was bit by a "baby copperhead" on the right medial aspect of his ankle.  The species of the snake was confirmed by EMS that was infected copperhead.  Patient denies associated pain swelling, bleeding or oozing.  Denies any numbness or tingling in his leg.  Denies chills.  HPI     Home Medications Prior to Admission medications   Medication Sig Start Date End Date Taking? Authorizing Provider  levETIRAcetam (KEPPRA) 500 MG tablet Take 1 tablet (500 mg total) by mouth 2 (two) times daily. 07/28/19 09/26/19  Darci Current, MD  naproxen (NAPROSYN) 500 MG tablet Take 1 tablet (500 mg total) by mouth 2 (two) times daily. 01/14/22   Haskel Schroeder, PA-C      Allergies    Patient has no known allergies.    Review of Systems   See HPI for pertinent positives  Physical Exam Updated Vital Signs BP 114/76 (BP Location: Left Arm)   Pulse 82   Temp 97.8 F (36.6 C) (Oral)   Resp 15   SpO2 100%  Physical Exam Vitals and nursing note reviewed.  HENT:     Head: Normocephalic and atraumatic.     Mouth/Throat:     Mouth: Mucous membranes are moist.  Eyes:     General:        Right eye: No discharge.        Left eye: No discharge.     Conjunctiva/sclera: Conjunctivae normal.  Cardiovascular:     Rate and Rhythm: Normal rate and regular rhythm.     Pulses: Normal pulses.     Heart sounds: Normal heart sounds.  Pulmonary:     Effort: Pulmonary effort is normal.     Breath sounds: Normal breath sounds.  Abdominal:     General: Abdomen is flat.     Palpations: Abdomen is soft.  Musculoskeletal:     Comments: 2 tiny punctate lesions noted in the medial aspect of the ankle just proximal to the  ankle joint.  Not oozing or bleeding.  No no associated fluctuance, induration, erythema or edema noted.  Not tender to touch.  Skin:    General: Skin is warm and dry.  Neurological:     General: No focal deficit present.  Psychiatric:        Mood and Affect: Mood normal.     ED Results / Procedures / Treatments   Labs (all labs ordered are listed, but only abnormal results are displayed) Labs Reviewed  BASIC METABOLIC PANEL  CBC WITH DIFFERENTIAL/PLATELET  PROTIME-INR  FIBRINOGEN  CBC WITH DIFFERENTIAL/PLATELET  CBC WITH DIFFERENTIAL/PLATELET  PROTIME-INR  PROTIME-INR  FIBRINOGEN  FIBRINOGEN    EKG None  Radiology No results found.  Procedures Procedures    Medications Ordered in ED Medications - No data to display  ED Course/ Medical Decision Making/ A&P Clinical Course as of 06/16/23 2312  Mon Jun 16, 2023  1658 This is a 32 year old male presented to ED with concern for potential copperhead bite to his left lower leg.  This occurred around 140, and he reports there has been no worsening pain or swelling around the leg.  His ventilation score is low on arrival.  Patient is well-appearing now 4 hours after his initial steak bite.  We will update the tetanus here in the ED, check INR and fibrinogen level, observe for period of time and then likely discharge.  At this time there is no indication for CroFab or antivenom [MT]  1751 RN reporting patient has eloped [MT]    Clinical Course User Index [MT] Trifan, Kermit Balo, MD                                 Medical Decision Making Amount and/or Complexity of Data Reviewed Labs: ordered.   32 year old well-appearing male presenting for snakebite.  Exam notable for tiny punctate lesions about the right medial ankle.  At this time does not appear to be infected or inflamed.  On reassessment patient continued to deny pain and swelling.  Labs also reassuring.  At this time, not meet criteria for antivenom treatment. Had  plan to observe him but patient eloped.  Fortunately, patient was in the ED for approximately 4 hours and continues to remain well with no evidence of worsening or active infection or inflammation around the bite lesions.        Final Clinical Impression(s) / ED Diagnoses Final diagnoses:  Snake bite, initial encounter    Rx / DC Orders ED Discharge Orders     None         Gareth Eagle, PA-C 06/16/23 2312    Terald Sleeper, MD 06/16/23 2317

## 2023-10-09 ENCOUNTER — Encounter (HOSPITAL_COMMUNITY): Payer: Self-pay | Admitting: Emergency Medicine

## 2023-10-09 ENCOUNTER — Emergency Department (HOSPITAL_COMMUNITY)
Admission: EM | Admit: 2023-10-09 | Discharge: 2023-10-09 | Disposition: A | Discharge status: Home / Self Care | Payer: Medicaid Other | Attending: Emergency Medicine | Admitting: Emergency Medicine

## 2023-10-09 DIAGNOSIS — K0889 Other specified disorders of teeth and supporting structures: Secondary | ICD-10-CM | POA: Diagnosis not present

## 2023-10-09 MED ORDER — PENICILLIN V POTASSIUM 500 MG PO TABS: 500.0000 mg | ORAL_TABLET | Freq: Four times a day (QID) | ORAL | 0 refills | Status: AC

## 2023-10-09 NOTE — Discharge Instructions (Addendum)
Please take entire course of antibiotics as directed.  Continue using Tylenol, as well as Orajel for pain.  You will need to follow-up with your dentist for continued management of this.  Return to the emergency department for fevers, swelling or pain under the tongue or in the neck, difficulty breathing or swallowing or any other new or concerning symptoms.

## 2023-10-09 NOTE — ED Provider Notes (Signed)
[facility] EMERGENCY DEPARTMENT AT [facility] Provider Note   CSN: [id number] Arrival date & time: 10/09/23  5784     History  Chief Complaint  Patient presents with   Dental Pain    Jeremiah Alvarez is a 32 y.o. male.  Jeremiah Alvarez is a 32 y.o. male who is otherwise healthy, presents to the ED for evaluation of dental pain.  I had a right lower molar that broke about a month ago and over the past 3 days he has had increasing pain.  Has tried over-the-counter pain medications with minimal relief, does have history of previous gastric ulcer but has been using NSAIDs, no bleeding reported.  Reports he does not have a dentist for follow-up.  No facial swelling, no pain under the tongue, no difficulty swallowing, some pain with chewing over the affected tooth.  The history is provided by the patient.  Dental Pain Associated symptoms: no fever        Home Medications Prior to Admission medications   Medication Sig Start Date End Date Taking? Authorizing Provider  levETIRAcetam (KEPPRA) 500 MG tablet Take 1 tablet (500 mg total) by mouth 2 (two) times daily. 07/28/19 09/26/19  Darci Current, MD  naproxen (NAPROSYN) 500 MG tablet Take 1 tablet (500 mg total) by mouth 2 (two) times daily. 01/14/22   Haskel Schroeder, PA-C      Allergies    Patient has no known allergies.    Review of Systems   Review of Systems  Constitutional:  Negative for chills and fever.  HENT:  Positive for dental problem.     Physical Exam Updated Vital Signs BP 129/83 (BP Location: Left Arm)   Pulse 60   Temp 97.6 F (36.4 C) (Oral)   Resp 18   Ht 5\' 6"  (1.676 m)   Wt 68 kg   SpO2 97%   BMI 24.21 kg/m  Physical Exam Vitals and nursing note reviewed.  Constitutional:      General: He is not in acute distress.    Appearance: Normal appearance. He is well-developed. He is not ill-appearing or diaphoretic.  HENT:     Head: Normocephalic and atraumatic.      Mouth/Throat:     Comments: Right lower molar with tooth broken at the gumline with apparent decay, no surrounding erythema or obvious abscess, no tenderness, swelling or induration under the tongue, posterior oropharynx clear, normal phonation, tolerating secretions Eyes:     General:        Right eye: No discharge.        Left eye: No discharge.  Pulmonary:     Effort: Pulmonary effort is normal. No respiratory distress.  Musculoskeletal:     Cervical back: Neck supple. No rigidity.  Lymphadenopathy:     Cervical: No cervical adenopathy.  Neurological:     Mental Status: He is alert and oriented to person, place, and time.     Coordination: Coordination normal.  Psychiatric:        Mood and Affect: Mood normal.        Behavior: Behavior normal.     ED Results / Procedures / Treatments   Labs (all labs ordered are listed, but only abnormal results are displayed) Labs Reviewed - No data to display  EKG None  Radiology No results found.  Procedures Procedures    Medications Ordered in ED Medications - No data to display  ED Course/ Medical Decision Making/ A&P  Medical Decision Making Risk Prescription drug management.   Patient with toothache.  No gross abscess.  Exam unconcerning for Ludwig's angina or spread of infection.  Will treat with penicillin and anti-inflammatories medicine.  Urged patient to follow-up with dentist.           Final Clinical Impression(s) / ED Diagnoses Final diagnoses:  Pain, dental    Rx / DC Orders ED Discharge Orders          Ordered    penicillin v potassium (VEETID) 500 MG tablet  4 times daily        10/09/23 0719              Rosezella Rumpf, PA-C 10/13/23 0836    Melene Plan, DO 10/13/23 1250

## 2023-10-09 NOTE — ED Triage Notes (Signed)
Pt had back lower tooth that broke off about a month ago. Began to hurt a few days ago. Pt has taken OTC med for it and not helping with pain. No swelling noted.
